# Patient Record
Sex: Female | Born: 1995
Health system: Southern US, Community
[De-identification: ages and names within clinical notes are randomized; demographics above are authoritative.]

## PROBLEM LIST (undated history)

## (undated) DIAGNOSIS — D649 Anemia, unspecified: Secondary | ICD-10-CM

## (undated) DIAGNOSIS — K589 Irritable bowel syndrome without diarrhea: Secondary | ICD-10-CM

## (undated) DIAGNOSIS — R000 Tachycardia, unspecified: Secondary | ICD-10-CM

## (undated) DIAGNOSIS — K2970 Gastritis, unspecified, without bleeding: Secondary | ICD-10-CM

## (undated) DIAGNOSIS — G43909 Migraine, unspecified, not intractable, without status migrainosus: Secondary | ICD-10-CM

## (undated) DIAGNOSIS — D249 Benign neoplasm of unspecified breast: Secondary | ICD-10-CM

## (undated) HISTORY — DX: Benign neoplasm of unspecified breast: D24.9

## (undated) HISTORY — DX: Migraine, unspecified, not intractable, without status migrainosus: G43.909

## (undated) HISTORY — PX: ANKLE SURGERY: SHX546

---
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
## 2015-02-19 NOTE — Consult Note (Signed)
PATIENT NAME:  Deborah Weber, Deborah Weber MR#:  [id number] DATE OF BIRTH:  [birth year]  DATE OF CONSULTATION:  07/19/2012  REFERRING PHYSICIAN:  [facility] CONSULTING PHYSICIAN:  Shadae Reino D. Taliana Mersereau, MD  INDICATION: Chest pain.  HISTORY OF PRESENT ILLNESS:  Ms. Beavers is a 19 year old African American female, active, recently postoperative from breast biopsy by Dr. Jamal Collin who complained of recurrent chest pain. This is a long-standing chronic problem, off and on. She has been evaluated when she was in the fourth grade, saw somebody in [place], is not clear on the name. Workup at that time was negative including a Holter. She has had some recurrent evaluations in the emergency room, most recently about two years ago. At that point, she had evaluation and limited work-up was also negative. She presented with left upper chest wall pain worse with deep inspiration and breathing and cough. The pain is usually at worse 6 out of 10. She does not recall any tachycardia, palpitations, or syncope. She is not really short of breath with this. She is not really taking any medications to help with it and the pain is intermittent and recurrent and self-limiting. She denies any trauma in the past.   REVIEW OF SYSTEMS: No blackout spells or syncope. No nausea or vomiting. No fever and no chills. No sweats, no weight loss, and no weight gain. No hemoptysis and no hematemesis. No bright red blood rectum.   PAST MEDICAL HISTORY: Essentially negative.   PAST SURGICAL HISTORY: Breast biopsy.  FAMILY HISTORY: Noncontributory.   SOCIAL HISTORY: She is a Ship broker. No smoking or alcohol consumption.   ALLERGIES: No known drug allergies.   PHYSICAL EXAMINATION:   VITAL SIGNS: Blood pressure 120/60, pulse 80, respiratory rate 18, and afebrile.   HEENT: Normocephalic, atraumatic. Pupils are reactive to light.   NECK: Supple. No jugular venous distention, bruits, or adenopathy.   LUNGS: Clear to auscultation  and percussion. No significant wheeze, rhonchi, or rales.   HEART: Regular rate and rhythm.   ABDOMEN: Positive bowel sounds. No rebound, guarding, or tenderness.   EXTREMITY: Examination is within normal limits.   NEUROLOGIC: Examination is intact.   SKIN: Normal.   HEART: She has a systolic ejection murmur in the left sternal border, 2/6. PMI is nondisplaced.   LABORATORY/DIAGNOSTIC DATA: EKG: Normal sinus rhythm and bradycardia with subtle ST elevation diffusely suggestive of J-point elevation.   Other preoperative laboratories are unremarkable.  ASSESSMENT:  1. Chest pain. 2. Murmur. 3. Status post breast biopsy.   PLAN: I would probably proceed with an echocardiogram to evaluate the patient's underlying cardiac structures to be sure they are normal and evaluation of murmur. We will probably hold off on a Holter monitor at this point. We will try to treat her conservatively with Motrin and Tylenol at this point. I suspect this is benign, nonspecific pain and aggressive further work-up I        think is not necessary. If her heart looks reasonably structurally normal and murmur shows to be benign, would probably defer further treatment and evaluation and recommend just conservative symptomatic therapy with Tylenol and Motrin. ____________________________ Loran Senters. Clayborn Bigness, MD ddc:slb D: 07/19/2012 15:16:27 ET T: 07/19/2012 16:23:53 ET JOB#: [id number]  cc: Sofija Antwi D. Clayborn Bigness, MD, <Dictator> Yolonda Kida MD ELECTRONICALLY SIGNED 08/20/2012 19:10

## 2015-02-22 NOTE — Op Note (Signed)
PATIENT NAME:  Deborah, Weber MR#:  [id number] DATE OF BIRTH:  06-Apr-1996  DATE OF PROCEDURE:  10/24/2013  PREOPERATIVE DIAGNOSIS:  Trimalleolar ankle fracture, left ankle.   POSTOPERATIVE DIAGNOSIS:  Trimalleolar ankle fracture, left ankle.  PROCEDURE: Open reduction and internal fixation medial malleolus and syndesmosis.   ANESTHESIA: General.   SURGEON: Hessie Knows, M.D.   DESCRIPTION OF PROCEDURE: The patient was brought to the operating room and after adequate anesthesia was obtained, the left leg was prepped and draped in the usual sterile fashion with a bump underneath the left buttock to internally rotate the leg. A tourniquet applied to the upper thigh. After prepping and draping in the usual sterile fashion and appropriate patient identification and timeout, procedure were carried out. The tourniquet was raised to 300 mmHg with inversion of the foot near anatomic alignment could be obtained of the syndesmosis and the fibular fracture. A small incision was made laterally and soft tissue spread. The 2.5 Synthes drill bit was used to drill across the fibula into the tibia, measuring and placing a 3.5 cortical screw. There is stability restored through the syndesmosis and on AP and lateral projections it appeared the small posterior malleolar fragment also was partially reduced.  Following this, reduction the medial malleolus was approached through an anterior medial incision, subcutaneous tissue spread and the fracture site opened and irrigated without any loose fragments from the joint. With the medial malleolar fragment held in place, a guidewire was inserted and measured, drilled, and a 32 mm, 4.5 partially-threaded cancellous screw was inserted. This gave good compression at the fracture site. At this point under stress views, there is no instability to the ankle. The wounds were irrigated and closed with 3-0 Vicryl subcutaneously, a 4-0 Monocryl subcuticular closure. And Dermabond,  Telfa, 4 x 4's, Webril ABD and stirrup splint were applied followed by Ace wrap. The patient was sent to recovery room in stable condition.  Tourniquet time: 32 minutes at 300 mmHg.  There were no complications.   SPECIMEN: None.    ____________________________ Laurene Footman, MD mjm:ce D: 10/24/2013 16:12:14 ET T: 10/24/2013 17:31:07 ET JOB#: [id number]  cc: Laurene Footman, MD, <Dictator> Laurene Footman MD ELECTRONICALLY SIGNED 10/25/2013 6:42

## 2016-04-11 ENCOUNTER — Emergency Department
Admission: EM | Admit: 2016-04-11 | Discharge: 2016-04-11 | Disposition: A | Discharge status: Home / Self Care | Payer: Worker's Compensation | Attending: Emergency Medicine | Admitting: Emergency Medicine

## 2016-04-11 ENCOUNTER — Encounter: Payer: Self-pay | Admitting: Emergency Medicine

## 2016-04-11 ENCOUNTER — Emergency Department: Payer: Worker's Compensation

## 2016-04-11 DIAGNOSIS — Y990 Civilian activity done for income or pay: Secondary | ICD-10-CM | POA: Insufficient documentation

## 2016-04-11 DIAGNOSIS — Y929 Unspecified place or not applicable: Secondary | ICD-10-CM | POA: Diagnosis not present

## 2016-04-11 DIAGNOSIS — S60111A Contusion of right thumb with damage to nail, initial encounter: Secondary | ICD-10-CM | POA: Insufficient documentation

## 2016-04-11 DIAGNOSIS — F17200 Nicotine dependence, unspecified, uncomplicated: Secondary | ICD-10-CM | POA: Insufficient documentation

## 2016-04-11 DIAGNOSIS — S6991XA Unspecified injury of right wrist, hand and finger(s), initial encounter: Secondary | ICD-10-CM | POA: Diagnosis present

## 2016-04-11 DIAGNOSIS — W208XXA Other cause of strike by thrown, projected or falling object, initial encounter: Secondary | ICD-10-CM | POA: Diagnosis not present

## 2016-04-11 DIAGNOSIS — Y9389 Activity, other specified: Secondary | ICD-10-CM | POA: Insufficient documentation

## 2016-04-11 MED ORDER — TRAMADOL HCL 50 MG PO TABS
50.0000 mg | ORAL_TABLET | Freq: Once | ORAL | Status: AC
Start: 2016-04-11 — End: 2016-04-11
  Administered 2016-04-11: 50 mg via ORAL
  Filled 2016-04-11: qty 1

## 2016-04-11 MED ORDER — TRAMADOL HCL 50 MG PO TABS: 50.0000 mg | ORAL_TABLET | Freq: Four times a day (QID) | ORAL | Status: DC | PRN

## 2016-04-11 MED ORDER — IBUPROFEN 600 MG PO TABS
600.0000 mg | ORAL_TABLET | Freq: Once | ORAL | Status: AC
  Administered 2016-04-11: 600 mg via ORAL
  Filled 2016-04-11: qty 1

## 2016-04-11 MED ORDER — IBUPROFEN 600 MG PO TABS: 600.0000 mg | ORAL_TABLET | Freq: Four times a day (QID) | ORAL | Status: DC | PRN

## 2016-04-11 NOTE — ED Notes (Signed)
Reports she was at work and Oncologist of plates were dropped on her right hand.  Patient complains of right thumb pain.

## 2016-04-11 NOTE — Discharge Instructions (Signed)
Wear splint for 2-3 days as needed. °

## 2016-04-11 NOTE — ED Notes (Signed)
Spoke with Freight forwarder, Erlene Quan at [facility] who says no urine drug screen required

## 2016-04-11 NOTE — ED Provider Notes (Signed)
[facility] Emergency Department Provider Note   ____________________________________________  Time seen: Approximately 8:52 PM  I have reviewed the triage vital signs and the nursing notes.   HISTORY  Chief Complaint Finger Injury    HPI Deborah Weber is a 20 y.o. female patient complaining of right thumb pain secondary to a stack loss place falling on her right hand. Patient's the incident occurred at work prior to arrival. Patient is right-hand dominant. Patient rates the pain as a 4/10. Patient states pain increase with flexion of the affected digit. No palliative measures applied until arrival at the ED she was given ice pack.   Past Medical History  Diagnosis Date  . Benign neoplasm of breast     fibroadenoma right breast    Patient Active Problem List   Diagnosis Date Noted  . History of breast lump/mass excision 07/25/2013  . Fibroadenoma of breast 07/25/2013    Past Surgical History  Procedure Laterality Date  . Breast biopsy Right 2013    excision fibroadenoma  . Ankle surgery Left     Current Outpatient Rx  Name  Route  Sig  Dispense  Refill  . ibuprofen (ADVIL,MOTRIN) 600 MG tablet   Oral   Take 1 tablet (600 mg total) by mouth every 6 (six) hours as needed.   30 tablet   0   . Norgestimate-Ethinyl Estradiol Triphasic (ORTHO TRI-CYCLEN, 28,) 0.18/0.215/0.25 MG-35 MCG tablet   Oral   Take 1 tablet by mouth daily.         . traMADol (ULTRAM) 50 MG tablet   Oral   Take 1 tablet (50 mg total) by mouth every 6 (six) hours as needed for moderate pain.   12 tablet   0     Allergies Review of patient's allergies indicates no known allergies.  No family history on file.  Social History Social History  Substance Use Topics  . Smoking status: Current Some Day Smoker  . Smokeless tobacco: Never Used  . Alcohol Use: Yes    Review of Systems Constitutional: No fever/chills Eyes: No visual changes. ENT: No  sore throat. Cardiovascular: Denies chest pain. Respiratory: Denies shortness of breath. Gastrointestinal: No abdominal pain.  No nausea, no vomiting.  No diarrhea.  No constipation. Genitourinary: Negative for dysuria. Musculoskeletal: Right thumb pain  Skin: Negative for rash. Neurological: Negative for headaches, focal weakness or numbness.     ____________________________________________   PHYSICAL EXAM:  VITAL SIGNS: ED Triage Vitals  Enc Vitals Group     BP 04/11/16 2023 118/75 mmHg     Pulse Rate 04/11/16 2023 85     Resp 04/11/16 2023 18     Temp 04/11/16 2023 98.2 F (36.8 C)     Temp Source 04/11/16 2023 Oral     SpO2 04/11/16 2023 99 %     Weight 04/11/16 2023 158 lb (71.668 kg)     Height 04/11/16 2023 5\' 7"  (1.702 m)     Head Cir --      Peak Flow --      Pain Score 04/11/16 2024 4     Pain Loc --      Pain Edu? --      Excl. in [place]? --     Constitutional: Alert and oriented. Well appearing and in no acute distress. Eyes: Conjunctivae are normal. PERRL. EOMI. Head: Atraumatic. Nose: No congestion/rhinnorhea. Mouth/Throat: Mucous membranes are moist.  Oropharynx non-erythematous. Neck: No stridor.  No cervical spine tenderness to palpation.  Hematological/Lymphatic/Immunilogical: No cervical lymphadenopathy. Cardiovascular: Normal rate, regular rhythm. Grossly normal heart sounds.  Good peripheral circulation. Respiratory: Normal respiratory effort.  No retractions. Lungs CTAB. Gastrointestinal: Soft and nontender. No distention. No abdominal bruits. No CVA tenderness. Musculoskeletal: No obvious deformity of the right thumb. Neurovascular intact. Patient is moderate guarding with palpation of the mid phalange. Decreased range of motion limited by complaining of pain with flexion.  Neurologic:  Normal speech and language. No gross focal neurologic deficits are appreciated. No gait instability. Skin:  Skin is warm, dry and intact. No rash noted. Psychiatric:  Mood and affect are normal. Speech and behavior are normal.  ____________________________________________   LABS (all labs ordered are listed, but only abnormal results are displayed)  Labs Reviewed - No data to display ____________________________________________  EKG   ____________________________________________  RADIOLOGY  No acute final chest x-ray of the right thumb. ____________________________________________   PROCEDURES  Procedure(s) performed: None  Critical Care performed: No  ____________________________________________   INITIAL IMPRESSION / ASSESSMENT AND PLAN / ED COURSE  Pertinent labs & imaging results that were available during my care of the patient were reviewed by me and considered in my medical decision making (see chart for details).  Thumb contusion. Discussed x-ray results with patient. Patient placed in a finger splint and given prescription for tramadol and ibuprofen. Patient advised to follow-up with family doctor condition does not improve in 2-3 days. ____________________________________________   FINAL CLINICAL IMPRESSION(S) / ED DIAGNOSES  Final diagnoses:  Contusion of right thumb nail, initial encounter      NEW MEDICATIONS STARTED DURING THIS VISIT:  New Prescriptions   IBUPROFEN (ADVIL,MOTRIN) 600 MG TABLET    Take 1 tablet (600 mg total) by mouth every 6 (six) hours as needed.   TRAMADOL (ULTRAM) 50 MG TABLET    Take 1 tablet (50 mg total) by mouth every 6 (six) hours as needed for moderate pain.     Note:  This document was prepared using Dragon voice recognition software and may include unintentional dictation errors.    Sable Feil, PA-C 04/11/16 2126  Delman Kitten, MD 04/12/16 [phone]

## 2016-09-12 ENCOUNTER — Encounter: Payer: Self-pay | Admitting: Emergency Medicine

## 2016-09-12 ENCOUNTER — Emergency Department
Admission: EM | Admit: 2016-09-12 | Discharge: 2016-09-13 | Disposition: A | Discharge status: Home / Self Care | Payer: BC Managed Care – PPO | Attending: Emergency Medicine | Admitting: Emergency Medicine

## 2016-09-12 ENCOUNTER — Emergency Department: Payer: BC Managed Care – PPO

## 2016-09-12 DIAGNOSIS — R69 Illness, unspecified: Secondary | ICD-10-CM

## 2016-09-12 DIAGNOSIS — R8299 Other abnormal findings in urine: Secondary | ICD-10-CM | POA: Insufficient documentation

## 2016-09-12 DIAGNOSIS — Z79899 Other long term (current) drug therapy: Secondary | ICD-10-CM | POA: Insufficient documentation

## 2016-09-12 DIAGNOSIS — F17200 Nicotine dependence, unspecified, uncomplicated: Secondary | ICD-10-CM | POA: Diagnosis not present

## 2016-09-12 DIAGNOSIS — R42 Dizziness and giddiness: Secondary | ICD-10-CM | POA: Diagnosis present

## 2016-09-12 DIAGNOSIS — J111 Influenza due to unidentified influenza virus with other respiratory manifestations: Secondary | ICD-10-CM

## 2016-09-12 DIAGNOSIS — B349 Viral infection, unspecified: Secondary | ICD-10-CM | POA: Insufficient documentation

## 2016-09-12 LAB — CBC WITH DIFFERENTIAL/PLATELET
BASOS PCT: 1 %
Basophils Absolute: 0 10*3/uL (ref 0–0.1)
Eosinophils Absolute: 0 10*3/uL (ref 0–0.7)
Eosinophils Relative: 0 %
HEMATOCRIT: 38.3 % (ref 35.0–47.0)
Hemoglobin: 12.8 g/dL (ref 12.0–16.0)
LYMPHS ABS: 0.7 10*3/uL — AB (ref 1.0–3.6)
Lymphocytes Relative: 11 %
MCH: 23.9 pg — AB (ref 26.0–34.0)
MCHC: 33.5 g/dL (ref 32.0–36.0)
MCV: 71.3 fL — AB (ref 80.0–100.0)
MONO ABS: 0.9 10*3/uL (ref 0.2–0.9)
MONOS PCT: 14 %
NEUTROS ABS: 4.7 10*3/uL (ref 1.4–6.5)
Neutrophils Relative %: 74 %
Platelets: 205 10*3/uL (ref 150–440)
RBC: 5.37 MIL/uL — ABNORMAL HIGH (ref 3.80–5.20)
RDW: 15.1 % — AB (ref 11.5–14.5)
WBC: 6.3 10*3/uL (ref 3.6–11.0)

## 2016-09-12 LAB — COMPREHENSIVE METABOLIC PANEL
ALT: 22 U/L (ref 14–54)
AST: 21 U/L (ref 15–41)
Albumin: 4.4 g/dL (ref 3.5–5.0)
Alkaline Phosphatase: 59 U/L (ref 38–126)
Anion gap: 7 (ref 5–15)
BILIRUBIN TOTAL: 0.5 mg/dL (ref 0.3–1.2)
BUN: 8 mg/dL (ref 6–20)
CHLORIDE: 102 mmol/L (ref 101–111)
CO2: 26 mmol/L (ref 22–32)
CREATININE: 0.74 mg/dL (ref 0.44–1.00)
Calcium: 9.2 mg/dL (ref 8.9–10.3)
GFR calc Af Amer: >60 mL/min (ref >60)
GLUCOSE: 82 mg/dL (ref 65–99)
Potassium: 3.6 mmol/L (ref 3.5–5.1)
Sodium: 135 mmol/L (ref 135–145)
Total Protein: 8.8 g/dL — ABNORMAL HIGH (ref 6.5–8.1)

## 2016-09-12 LAB — URINALYSIS COMPLETE WITH MICROSCOPIC (ARMC ONLY)
BILIRUBIN URINE: NEGATIVE
GLUCOSE, UA: NEGATIVE mg/dL
Ketones, ur: NEGATIVE mg/dL
Leukocytes, UA: NEGATIVE
NITRITE: NEGATIVE
Protein, ur: NEGATIVE mg/dL
Specific Gravity, Urine: 1.006 (ref 1.005–1.030)
pH: 7 (ref 5.0–8.0)

## 2016-09-12 LAB — INFLUENZA PANEL BY PCR (TYPE A & B)
INFLAPCR: NEGATIVE
INFLBPCR: NEGATIVE

## 2016-09-12 LAB — HCG, QUANTITATIVE, PREGNANCY

## 2016-09-12 LAB — LACTIC ACID, PLASMA: Lactic Acid, Venous: 1.2 mmol/L (ref 0.5–1.9)

## 2016-09-12 MED ORDER — SODIUM CHLORIDE 0.9 % IV SOLN
1000.0000 mL | Freq: Once | INTRAVENOUS | Status: AC
  Administered 2016-09-12: 1000 mL via INTRAVENOUS

## 2016-09-12 MED ORDER — ACETAMINOPHEN 325 MG PO TABS
650.0000 mg | ORAL_TABLET | Freq: Once | ORAL | Status: AC
  Administered 2016-09-12: 650 mg via ORAL
  Filled 2016-09-12: qty 2

## 2016-09-12 NOTE — ED Provider Notes (Signed)
-----------------------------------------   11:21 PM on 09/12/2016 -----------------------------------------   Blood pressure 122/82, pulse (!) 123, temperature (!) 102.4 F (39.1 C), temperature source Oral, resp. rate 18, height 5\' 8"  (1.727 m), weight 75.9 kg, last menstrual period 08/26/2016, SpO2 100 %.  Assuming care from Dr. Corky Downs.  In short, Deborah Weber is a 20 y.o. female with a chief complaint of Chills; Near Syncope; Fatigue; Sore Throat; Fever; and Generalized Body Aches .  Refer to the original H&P for additional details.  The current plan of care is to provide a second liter of IV fluids and check the remainder of her labs including influenza panel.  Dr. Corky Downs doubts need for admission if we can improve her vital signs and rule out acute bacterial infection.Marland Kitchen   ----------------------------------------- 1:01 AM on 09/13/2016 -----------------------------------------  The patient's heart rate is down to the low 110s.  She still has a bit of a fever for which I gave her a dose of ibuprofen.  Her workup was reassuring with a normal lactate, no leukocytosis, normal metabolic panel, negative influenza panel, normal chest x-ray, normal urinalysis.  Blood cultures are pending.  I gave her the reassuring results and my usual and customary management recommendations and return precautions.  She and her family member understand and agree with the plan.   Hinda Kehr, MD 09/13/16 [phone]

## 2016-09-12 NOTE — ED Notes (Signed)
Patient transported to X-ray 

## 2016-09-12 NOTE — ED Provider Notes (Signed)
[facility] Emergency Department Provider Note   ____________________________________________    I have reviewed the triage vital signs and the nursing notes.   HISTORY  Chief Complaint Chills; Near Syncope; Fatigue; Sore Throat; Fever; and Generalized Body Aches     HPI Deborah Weber is a 20 y.o. female who presents with complaints of lightheadedness. Patient reports she felt like she was going to faint approximately 1 hour prior to arrival. She also reports she has had chills and body aches. She denies nausea or vomiting. No recent travel. No sick contacts reported. No chest pain or palpitations. She reports she was seen in the [place] ED for a near syncopal episode 2 weeks ago but they were unable to determine a cause. She denies cough or shortness of breath. No dysuria or vaginal discharge. no neck pain or stiffness   Past Medical History:  Diagnosis Date  . Benign neoplasm of breast    fibroadenoma right breast    Patient Active Problem List   Diagnosis Date Noted  . History of breast lump/mass excision 07/25/2013  . Fibroadenoma of breast 07/25/2013    Past Surgical History:  Procedure Laterality Date  . ANKLE SURGERY Left   . BREAST BIOPSY Right 2013   excision fibroadenoma    Prior to Admission medications   Not on File     Allergies Patient has no known allergies.  History reviewed. No pertinent family history.  Social History Social History  Substance Use Topics  . Smoking status: Current Some Day Smoker  . Smokeless tobacco: Never Used  . Alcohol use Yes    Review of Systems  Constitutional: Positive chills Eyes: No discharge ENT: No sore throat. Cardiovascular: Denies chest pain. Respiratory: Denies shortness of breath. Gastrointestinal: No abdominal pain.  No nausea, no vomiting.   Genitourinary: Negative for dysuria. Musculoskeletal: Positive myalgias Skin: Negative for rash. Neurological: Negative for  headaches or weakness  10-point ROS otherwise negative.  ____________________________________________   PHYSICAL EXAM:  VITAL SIGNS: ED Triage Vitals  Enc Vitals Group     BP 09/12/16 2109 118/72     Pulse Rate 09/12/16 2109 (!) 138     Resp 09/12/16 2109 18     Temp 09/12/16 2109 (!) 102.4 F (39.1 C)     Temp Source 09/12/16 2109 Oral     SpO2 09/12/16 2109 100 %     Weight 09/12/16 2109 165 lb (74.8 kg)     Height 09/12/16 2109 5\' 8"  (1.727 m)     Head Circumference --      Peak Flow --      Pain Score 09/12/16 2120 6     Pain Loc --      Pain Edu? --      Excl. in [place]? --     Constitutional: Alert and oriented. No acute distress. Pleasant and interactive Eyes: Conjunctivae are normal.  Head: Atraumatic. Nose: No congestion/rhinnorhea. Mouth/Throat: Mucous membranes are moist.   Neck:  Painless ROM, No meningismus Cardiovascular: Tachycardia, regular rhythm. Grossly normal heart sounds.  Good peripheral circulation. Respiratory: Normal respiratory effort.  No retractions. Lungs CTAB. Gastrointestinal: Soft and nontender. No distention.  No CVA tenderness. Genitourinary: deferred Musculoskeletal: No lower extremity tenderness nor edema.  Warm and well perfused Neurologic:  Normal speech and language. No gross focal neurologic deficits are appreciated.  Skin:  Skin is warm, dry and intact. No rash noted. Psychiatric: Mood and affect are normal. Speech and behavior are normal.  ____________________________________________  LABS (all labs ordered are listed, but only abnormal results are displayed)  Labs Reviewed  COMPREHENSIVE METABOLIC PANEL - Abnormal; Notable for the following:       Result Value   Total Protein 8.8 (*)    All other components within normal limits  CBC WITH DIFFERENTIAL/PLATELET - Abnormal; Notable for the following:    RBC 5.37 (*)    MCV 71.3 (*)    MCH 23.9 (*)    RDW 15.1 (*)    Lymphs Abs 0.7 (*)    All other components within  normal limits  CULTURE, BLOOD (ROUTINE X 2)  CULTURE, BLOOD (ROUTINE X 2)  URINE CULTURE  LACTIC ACID, PLASMA  HCG, QUANTITATIVE, PREGNANCY  LACTIC ACID, PLASMA  URINALYSIS COMPLETEWITH MICROSCOPIC ([facility] ONLY)  INFLUENZA PANEL BY PCR (TYPE A & B, H1N1)   ____________________________________________  EKG  None ____________________________________________  RADIOLOGY  Chest x-ray unremarkable ____________________________________________   PROCEDURES  Procedure(s) performed: No    Critical Care performed: No ____________________________________________   INITIAL IMPRESSION / ASSESSMENT AND PLAN / ED COURSE  Pertinent labs & imaging results that were available during my care of the patient were reviewed by me and considered in my medical decision making (see chart for details).  Patient well-appearing and in no distress. Exam is notable only for tachycardia and fever. Patient had extensive lab work sent in triage of which is reassuring. Chest x-ray is benign. Pending urinalysis and influenza PCR. Strongly suspect viral illness given myalgias and fever, we will give IV fluids and provide supportive care and reevaluate.  I will ask my colleague Dr. Karma Greaser to follow up on influenza pcr and to reevaluate. Anticipate discharge  Clinical Course    ____________________________________________   FINAL CLINICAL IMPRESSION(S) / ED DIAGNOSES  Viral illness  NEW MEDICATIONS STARTED DURING THIS VISIT:  New Prescriptions   No medications on file     Note:  This document was prepared using Dragon voice recognition software and may include unintentional dictation errors.    Lavonia Drafts, MD 09/12/16 2250

## 2016-09-12 NOTE — ED Triage Notes (Addendum)
Pt presents with c/o chills and feeling like she's going to pass out; pt says she was admitted at [facility] 2 weeks ago for syncopal episodes and tachycardia; pt says she was discharged home with a heart monitor, wore that for 2 days,  but she has not heard any results; pt unsure what her discharge diagnosis was from [facility]; pt says she is feeling similar; adds 2 days ago she was seen at urgent care for sudden onset of facial/tongue swelling; pt presents with fever, which she did not know she had, itching throat and body aches;

## 2016-09-12 NOTE — ED Notes (Signed)
ED Provider at bedside. 

## 2016-09-13 MED ORDER — IBUPROFEN 600 MG PO TABS
600.0000 mg | ORAL_TABLET | Freq: Once | ORAL | Status: AC
  Administered 2016-09-13: 600 mg via ORAL
  Filled 2016-09-13: qty 1

## 2016-09-13 NOTE — Discharge Instructions (Signed)
You have been seen in the Emergency Department (ED) today for a likely viral illness.  Please drink plenty of clear fluids (water, Gatorade, chicken broth, etc).  You may use Tylenol and/or Motrin according to label instructions.  You can alternate between the two without any side effects. Drink plenty of clear fluids (water, Gatorade, etc), even if you do not feel like eating much food, though we encourage you to eat in order to keep up your strength.  Please follow up with your doctor as listed above.  Call your doctor or return to the Emergency Department (ED) if you are unable to tolerate fluids due to vomiting, have worsening trouble breathing, become extremely tired or difficult to awaken, or if you develop any other symptoms that concern you.

## 2016-09-14 LAB — URINE CULTURE

## 2016-09-17 LAB — CULTURE, BLOOD (ROUTINE X 2)
Culture: NO GROWTH
Culture: NO GROWTH

## 2017-03-19 ENCOUNTER — Encounter: Payer: Self-pay | Admitting: Advanced Practice Midwife

## 2017-03-19 ENCOUNTER — Ambulatory Visit (INDEPENDENT_AMBULATORY_CARE_PROVIDER_SITE_OTHER): Payer: BC Managed Care – PPO | Admitting: Advanced Practice Midwife

## 2017-03-19 VITALS — BP 100/62 | HR 86 | Ht 68.0 in | Wt 171.0 lb

## 2017-03-19 DIAGNOSIS — Z01419 Encounter for gynecological examination (general) (routine) without abnormal findings: Secondary | ICD-10-CM | POA: Diagnosis not present

## 2017-03-19 DIAGNOSIS — Z113 Encounter for screening for infections with a predominantly sexual mode of transmission: Secondary | ICD-10-CM | POA: Diagnosis not present

## 2017-03-19 DIAGNOSIS — A749 Chlamydial infection, unspecified: Secondary | ICD-10-CM

## 2017-03-19 DIAGNOSIS — N912 Amenorrhea, unspecified: Secondary | ICD-10-CM | POA: Diagnosis not present

## 2017-03-19 LAB — POCT URINE PREGNANCY: PREG TEST UR: NEGATIVE

## 2017-03-19 NOTE — Progress Notes (Signed)
Patient ID: Deborah Weber, female   DOB: [birth year], 21 y.o.   MRN: [id number]      Gynecology Annual Exam  PCP: Gayland Curry, MD  Chief Complaint:  Chief Complaint  Patient presents with  . Gynecologic Exam    Possible SAB 3 pos. test in Feb    History of Present Illness: Patient is a 21 y.o. G0P0 presents for annual exam. The patient has no complaints today. She had a possible pregnancy/miscarriage in February. She had positive home test and negative clinic test. Then she had bleeding a few days later at the end of February. She has Nexplanon which is now 21 years old. She has a negative pregnancy test today. She is considering options for birth control and will probably use Nexplanon again. She is an occasional smoker and is advised against using combined birth control products. She has had irregular bleeding ever since she has had the Nexplanon.  LMP: No LMP recorded. Patient has had an implant. Average Interval: irregular, no pattern days Duration of flow: several days Heavy Menses: no Clots: no Intermenstrual Bleeding: no Postcoital Bleeding: no Dysmenorrhea: no  The patient is sexually active. She currently uses Nexplanon for contraception. She denies dyspareunia.  The patient does perform self breast exams.  There is notable family history of breast or ovarian cancer in her family.  The patient wears seatbelts: yes.  The patient has regular exercise: yes.    The patient denies current symptoms of depression.    Review of Systems: Review of Systems  Constitutional: Negative.   HENT: Negative.   Eyes: Negative.   Respiratory: Negative.   Cardiovascular: Negative.   Gastrointestinal: Negative.   Genitourinary: Negative.   Musculoskeletal: Negative.   Skin: Negative.   Neurological: Negative.   Endo/Heme/Allergies: Negative.   Psychiatric/Behavioral: Negative.     Past Medical History:  Past Medical History:  Diagnosis Date  . Benign neoplasm of breast     fibroadenoma right breast    Past Surgical History:  Past Surgical History:  Procedure Laterality Date  . ANKLE SURGERY Left   . BREAST BIOPSY Right 2013   excision fibroadenoma    Gynecologic History:  No LMP recorded. Patient has had an implant. Contraception: Nexplanon Last Pap: Results were: has never had a PAP   Obstetric History: G0P0  Family History:  Family History  Problem Relation Age of Onset  . Colon cancer Paternal Aunt 60  . Breast cancer Paternal Grandmother 84    Social History:  Social History   Social History  . Marital status: Single    Spouse name: N/A  . Number of children: N/A  . Years of education: N/A   Occupational History  . Not on file.   Social History Main Topics  . Smoking status: Current Some Day Smoker  . Smokeless tobacco: Never Used  . Alcohol use Yes  . Drug use: No  . Sexual activity: Yes    Birth control/ protection: Implant   Other Topics Concern  . Not on file   Social History Narrative  . No narrative on file    Allergies:  No Known Allergies  Medications: Prior to Admission medications   Medication Sig Start Date End Date Taking? Authorizing Provider  EPINEPHrine 0.3 mg/0.3 mL IJ SOAJ injection Inject 0.3 mg into the muscle as needed. 09/07/16  Yes [provider]  etonogestrel (NEXPLANON) 68 MG IMPL implant Inject into the skin.   Yes [provider]    Physical Exam Vitals:  Blood pressure 100/62, pulse 86, height 5\' 8"  (1.727 m), weight 171 lb (77.6 kg).  General: NAD HEENT: normocephalic, anicteric Thyroid: no enlargement, no palpable nodules Pulmonary: No increased work of breathing, CTAB Cardiovascular: RRR, distal pulses 2+ Breast: Breast symmetrical, no tenderness, no palpable nodules or masses, no skin or nipple retraction present, no nipple discharge.  No axillary or supraclavicular lymphadenopathy. Abdomen: NABS, soft, non-tender, non-distended.  Umbilicus without lesions.   No hepatomegaly, splenomegaly or masses palpable. No evidence of hernia  Genitourinary:  External: Normal external female genitalia.  Normal urethral meatus, normal  Bartholin's and Skene's glands.    Vagina: Normal vaginal mucosa, no evidence of prolapse.    Cervix: Grossly normal in appearance, no bleeding, no CMT  Uterus: Non-enlarged, mobile, normal contour.    Adnexa: ovaries non-enlarged, no adnexal masses  Rectal: deferred  Lymphatic: no evidence of inguinal lymphadenopathy Extremities: no edema, erythema, or tenderness Neurologic: Grossly intact Psychiatric: mood appropriate, affect full    Assessment: 21 y.o. G0P0 Well woman exam without PAP  Plan: Problem List Items Addressed This Visit    None    Visit Diagnoses    Amenorrhea    -  Primary   Relevant Orders   POCT urine pregnancy (Completed)   Well woman exam with routine gynecological exam       Relevant Orders   Chlamydia/Gonococcus/Trichomonas, NAA   Screen for sexually transmitted diseases       Relevant Orders   Chlamydia/Gonococcus/Trichomonas, NAA      1) 4) Gardasil Series discussed and if applicable offered to patient - Patient is unsure if she has previously completed 3 shot series. She is advised to let us know if she wants the series if she has not already had it.  2) STI screening was offered and accepted   3) ASCCP guidelines and rational discussed.  Patient opts for begin at age 23 screening interval  4) Contraception - Education given regarding options for contraception, including IUD, Nexplanon, Depo and minipill. She is advised against combined pills/products due to her use of tobacco   5) Follow up soon for removal and reinsertion of Nexplanon and 1 year for routine annual exam   Rod Can, CNM

## 2017-03-22 LAB — CHLAMYDIA/GONOCOCCUS/TRICHOMONAS, NAA
CHLAMYDIA BY NAA: POSITIVE — AB
GONOCOCCUS BY NAA: NEGATIVE
TRICH VAG BY NAA: NEGATIVE

## 2017-03-23 MED ORDER — AZITHROMYCIN 500 MG PO TABS: 1000.0000 mg | ORAL_TABLET | Freq: Once | ORAL | 0 refills | Status: AC

## 2017-03-23 NOTE — Addendum Note (Signed)
Addended by: Rod Can on: 03/23/2017 04:40 PM   Modules accepted: Orders

## 2017-04-02 ENCOUNTER — Ambulatory Visit (INDEPENDENT_AMBULATORY_CARE_PROVIDER_SITE_OTHER): Payer: BC Managed Care – PPO | Admitting: Advanced Practice Midwife

## 2017-04-02 ENCOUNTER — Encounter: Payer: Self-pay | Admitting: Advanced Practice Midwife

## 2017-04-02 VITALS — BP 118/74 | Ht 68.0 in | Wt 175.0 lb

## 2017-04-02 DIAGNOSIS — Z308 Encounter for other contraceptive management: Secondary | ICD-10-CM

## 2017-04-02 DIAGNOSIS — Z113 Encounter for screening for infections with a predominantly sexual mode of transmission: Secondary | ICD-10-CM

## 2017-04-02 NOTE — Progress Notes (Signed)
GYNECOLOGY PROCEDURE NOTE  Nexplanon removal discussed in detail.  Risks of infection, bleeding, nerve injury all reviewed.  Patient understands risks and desires to proceed.  Verbal consent obtained.  Patient is certain she wants the Nexplanon removed.  All questions answered.  Procedure: Patient placed in dorsal supine with left arm above head, elbow flexed at 90 degrees, arm resting on examination table.  Nexplanon identified without problems.  Betadine scrub x3.  2 ml of 1% lidocaine injected under Nexplanon device without problems.  Sterile gloves applied.  Small 0.5cm incision made at distal tip of implanon device with 11 blade scalpel.  Nexplanon brought to incision and grasped with a small kelly clamp.  Nexplanon removed intact without problems.  Pressure applied to incision.  Patient tolerated procedure well.  No complications.   Assessment: 21 y.o. year old female now s/p uncomplicated Nexplanon removal.  Plan: 1.  Patient given post procedure precautions and asked to call for fever, chills, redness or drainage from her incision, bleeding from incision.  She understands she will likely have a small bruise near site of removal and can remove bandage tomorrow and steri-strips in approximately 1 week.  2) Contraception: reinsertion of new Nexplanon. See note below         GYNECOLOGY PROCEDURE NOTE  Patient is a 21 y.o. G0P0 presenting for Nexplanon insertion as her desires means of contraception.  She provided informed consent, signed copy in the chart, time out was performed. Pregnancy test was negative on 5/18. No LMP recorded. Patient has had an implant.  She understands that Nexplanon is a progesterone only therapy, and that patients often patients have irregular and unpredictable vaginal bleeding or amenorrhea. She understands that other side effects are possible related to systemic progesterone, including but not limited to, headaches, breast tenderness, nausea, and  irritability. While effective at preventing pregnancy long acting reversible contraceptives do not prevent transmission of sexually transmitted diseases and use of barrier methods for this purpose was discussed. The placement procedure for Nexplanon was reviewed with the patient in detail including risks of nerve injury, infection, bleeding and injury to other muscles or tendons. She understands that the Nexplanon implant is good for 3 years and needs to be removed at the end of that time.  She understands that Nexplanon is an extremely effective option for contraception, with failure rate of <1%. This information is reviewed today and all questions were answered. Informed consent was obtained, both verbally and written.   The patient is healthy and has no contraindications to Nexplanon use.   Procedure Appropriate time out taken.  Patient placed in dorsal supine with left arm above head, elbow flexed at 90 degrees, arm resting on examination table.  Nexplanon removed form sterile blister packaging,  Device confirmed in needle, before inserting full length of needle, tenting up the skin as the needle was advanced along the track of the device that was previously removed.  The drug eluting rod was then deployed by pulling back the slider per the manufactures recommendation.  The implant was palpable by the clinician as well as the patient.  The insertion site covered dressed with a band aid before applying  a kerlex bandage pressure dressing..Minimal blood loss was noted during the procedure.  The patient tolerated the procedure well.   She was instructed to wear the bandage for 24 hours, call with any signs of infection.  She was given the Nexplanon card and instructed to have the rod removed in 3 years.  Charge [phone] for nexplanon device, CPT [id number] for procedure

## 2017-04-02 NOTE — Addendum Note (Signed)
Addended by: Rod Can on: 04/02/2017 01:35 PM   Modules accepted: Orders

## 2017-07-09 ENCOUNTER — Emergency Department: Payer: BC Managed Care – PPO

## 2017-07-09 ENCOUNTER — Emergency Department
Admission: EM | Admit: 2017-07-09 | Discharge: 2017-07-09 | Discharge status: Against Medical Advice | Payer: BC Managed Care – PPO | Attending: Emergency Medicine | Admitting: Emergency Medicine

## 2017-07-09 ENCOUNTER — Encounter: Payer: Self-pay | Admitting: Emergency Medicine

## 2017-07-09 DIAGNOSIS — F17200 Nicotine dependence, unspecified, uncomplicated: Secondary | ICD-10-CM | POA: Diagnosis not present

## 2017-07-09 DIAGNOSIS — Z79899 Other long term (current) drug therapy: Secondary | ICD-10-CM | POA: Diagnosis not present

## 2017-07-09 DIAGNOSIS — R042 Hemoptysis: Secondary | ICD-10-CM | POA: Diagnosis present

## 2017-07-09 HISTORY — DX: Gastritis, unspecified, without bleeding: K29.70

## 2017-07-09 LAB — BASIC METABOLIC PANEL
Anion gap: 6 (ref 5–15)
BUN: 8 mg/dL (ref 6–20)
CO2: 24 mmol/L (ref 22–32)
CREATININE: 0.66 mg/dL (ref 0.44–1.00)
Calcium: 9.1 mg/dL (ref 8.9–10.3)
Chloride: 107 mmol/L (ref 101–111)
Glucose, Bld: 92 mg/dL (ref 65–99)
Potassium: 3.6 mmol/L (ref 3.5–5.1)
SODIUM: 137 mmol/L (ref 135–145)

## 2017-07-09 LAB — CBC
HCT: 34.9 % — ABNORMAL LOW (ref 35.0–47.0)
Hemoglobin: 11.9 g/dL — ABNORMAL LOW (ref 12.0–16.0)
MCH: 24.2 pg — AB (ref 26.0–34.0)
MCHC: 34 g/dL (ref 32.0–36.0)
MCV: 71.2 fL — ABNORMAL LOW (ref 80.0–100.0)
PLATELETS: 217 10*3/uL (ref 150–440)
RBC: 4.9 MIL/uL (ref 3.80–5.20)
RDW: 14.4 % (ref 11.5–14.5)
WBC: 4.9 10*3/uL (ref 3.6–11.0)

## 2017-07-09 NOTE — ED Triage Notes (Signed)
Pt reports one episode of coughing up blood today at 3:15. Pt reports coughed up approximately one tablespoon of bright red blood. Pt reports recently diagnosed with gastritis and has been told she had some bleeding in small intestine. Pt denies any pain. Denies NVD. Denies any recent cold or flu symptoms. Pt ambulatory to triage. No apparent distress noted.

## 2017-07-09 NOTE — ED Notes (Signed)
This nurse walked into room; Pt was not in room waited 15 minutes and checked with triage, pt not here at present time/without return. MD notified

## 2017-07-09 NOTE — ED Notes (Signed)
Pt not here to sign AMA form

## 2017-07-09 NOTE — ED Provider Notes (Addendum)
[facility] Emergency Department Provider Note  Time seen: 6:53 PM  I have reviewed the triage vital signs and the nursing notes.   HISTORY  Chief Complaint Hemoptysis    HPI Deborah Weber is a 21 y.o. female With a past medical history of gastritis on Protonix, presents to the emergency department with hemoptysis. According to the patient she was coughing tonight which she states was a prolonged coughing spell approximately 1 minute and then she got her sputum up which contained a large amount of blood. Patient states she has not had any blood since. Has not had any further cough since. Denies any fever. Denies any shortness of breath. Denies any chest pain. Patient denies any history of bleeding.  denies any complaints at this time.  Past Medical History:  Diagnosis Date  . 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 Gastritis     Patient Active Problem List   Diagnosis Date Noted  . History of breast lump/mass excision 07/25/2013  . Fibroadenoma of breast 07/25/2013    Past Surgical History:  Procedure Laterality Date  . ANKLE SURGERY Left   . BREAST BIOPSY Right 2013   excision fibroadenoma    Prior to Admission medications   Medication Sig Start Date End Date Taking? Authorizing Provider  pantoprazole (PROTONIX) 40 MG tablet Take 40 mg by mouth daily.   Yes [provider]  EPINEPHrine 0.3 mg/0.3 mL IJ SOAJ injection Inject 0.3 mg into the muscle as needed. 09/07/16   [provider]  etonogestrel (NEXPLANON) 68 MG IMPL implant Inject into the skin.    [provider]    No Known Allergies  Family History  Problem Relation Age of Onset  . Colon cancer Paternal Aunt 2  . Breast cancer Paternal Grandmother 20    Social History Social History  Substance Use Topics  . Smoking status: Current Some Day Smoker  . Smokeless tobacco: Never Used  . Alcohol use Yes    Review of  Systems Constitutional: Negative for fever. Cardiovascular: Negative for chest pain. Respiratory: Negative for shortness of breath. Gastrointestinal: Negative for abdominal pain. Negative nausea or vomiting. Neurological: Negative for headache All other ROS negative  ____________________________________________   PHYSICAL EXAM:  VITAL SIGNS: ED Triage Vitals [07/09/17 1601]  Enc Vitals Group     BP 128/73     Pulse Rate 72     Resp 18     Temp 98.7 F (37.1 C)     Temp Source Oral     SpO2 99 %     Weight 165 lb (74.8 kg)     Height 5\' 8"  (1.727 m)     Head Circumference      Peak Flow      Pain Score      Pain Loc      Pain Edu?      Excl. in [place]?     Constitutional: Alert and oriented. Well appearing and in no distress. Eyes: Normal exam ENT   Head: Normocephalic and atraumatic   Mouth/Throat: Mucous membranes are moist. Cardiovascular: Normal rate, regular rhythm. No murmur Respiratory: Normal respiratory effort without tachypnea nor retractions. Breath sounds are clear  Gastrointestinal: Soft and nontender. No distention.   Musculoskeletal: Nontender with normal range of motion in all extremities.  Neurologic:  Normal speech and language. No gross focal neurologic deficits  Skin:  Skin is warm, dry and intact.  Psychiatric: Mood and affect are normal.  ____________________________________________   INITIAL IMPRESSION / ASSESSMENT AND PLAN / ED COURSE  Pertinent labs & imaging results that were available during my care of the patient were reviewed by me and considered in my medical decision making (see chart for details).  the patient presents to the emergency department with hemoptysis 1 tonight. Patient denies any recent coughing, states she just had one spell of coughing and then was able to get her sputum up which contained blood. Denies any further coughing. Denies any chest pain or trouble breathing.  Patient's labs are normal. Patient's exam is  normal. Overall the patient is very well-appearing and states she has no complaints at this time. I ordered a chest x-ray to further evaluate. The patient is refusing x-ray at this time. States she just had one to 3 weeks ago and does not want a repeat x-ray, per x-ray tech.   I went to discuss this with the patientshortly after this and the patient had eloped from the emergency department.  ____________________________________________   FINAL CLINICAL IMPRESSION(S) / ED DIAGNOSES  hemoptysis    Harvest Dark, MD 07/09/17 [id number]    Harvest Dark, MD 07/09/17 [phone]

## 2017-07-09 NOTE — ED Notes (Signed)
Pt refused chest x-ray

## 2017-09-24 ENCOUNTER — Inpatient Hospital Stay (HOSPITAL_COMMUNITY)
Admission: AD | Admit: 2017-09-24 | Discharge: 2017-09-24 | Disposition: A | Discharge status: Home / Self Care | Payer: BC Managed Care – PPO | Source: Ambulatory Visit | Attending: Obstetrics and Gynecology | Admitting: Obstetrics and Gynecology

## 2017-09-24 ENCOUNTER — Encounter (HOSPITAL_COMMUNITY): Payer: Self-pay | Admitting: *Deleted

## 2017-09-24 DIAGNOSIS — R42 Dizziness and giddiness: Secondary | ICD-10-CM | POA: Insufficient documentation

## 2017-09-24 DIAGNOSIS — N921 Excessive and frequent menstruation with irregular cycle: Secondary | ICD-10-CM

## 2017-09-24 DIAGNOSIS — N941 Unspecified dyspareunia: Secondary | ICD-10-CM | POA: Diagnosis not present

## 2017-09-24 DIAGNOSIS — N939 Abnormal uterine and vaginal bleeding, unspecified: Secondary | ICD-10-CM | POA: Insufficient documentation

## 2017-09-24 DIAGNOSIS — F17200 Nicotine dependence, unspecified, uncomplicated: Secondary | ICD-10-CM | POA: Insufficient documentation

## 2017-09-24 DIAGNOSIS — Z975 Presence of (intrauterine) contraceptive device: Secondary | ICD-10-CM

## 2017-09-24 DIAGNOSIS — N946 Dysmenorrhea, unspecified: Secondary | ICD-10-CM | POA: Diagnosis not present

## 2017-09-24 HISTORY — DX: Tachycardia, unspecified: R00.0

## 2017-09-24 HISTORY — DX: Anemia, unspecified: D64.9

## 2017-09-24 LAB — WET PREP, GENITAL
Clue Cells Wet Prep HPF POC: NONE SEEN
SPERM: NONE SEEN
Trich, Wet Prep: NONE SEEN
YEAST WET PREP: NONE SEEN

## 2017-09-24 LAB — URINALYSIS, ROUTINE W REFLEX MICROSCOPIC
BILIRUBIN URINE: NEGATIVE
GLUCOSE, UA: NEGATIVE mg/dL
KETONES UR: NEGATIVE mg/dL
LEUKOCYTES UA: NEGATIVE
Nitrite: NEGATIVE
PH: 7 (ref 5.0–8.0)
Protein, ur: NEGATIVE mg/dL
SPECIFIC GRAVITY, URINE: 1.013 (ref 1.005–1.030)

## 2017-09-24 LAB — CBC
HCT: 34.6 % — ABNORMAL LOW (ref 36.0–46.0)
HEMOGLOBIN: 11.7 g/dL — AB (ref 12.0–15.0)
MCH: 24.3 pg — AB (ref 26.0–34.0)
MCHC: 33.8 g/dL (ref 30.0–36.0)
MCV: 71.8 fL — AB (ref 78.0–100.0)
Platelets: 238 10*3/uL (ref 150–400)
RBC: 4.82 MIL/uL (ref 3.87–5.11)
RDW: 15.1 % (ref 11.5–15.5)
WBC: 5.4 10*3/uL (ref 4.0–10.5)

## 2017-09-24 MED ORDER — IBUPROFEN 600 MG PO TABS: 600.0000 mg | ORAL_TABLET | Freq: Four times a day (QID) | ORAL | 1 refills | Status: DC | PRN

## 2017-09-24 MED ORDER — MEDROXYPROGESTERONE ACETATE 10 MG PO TABS: 10.0000 mg | ORAL_TABLET | Freq: Every day | ORAL | 1 refills | Status: DC

## 2017-09-24 NOTE — MAU Note (Signed)
Having lower abdominal cramping, started bleeding heavy last night, changing pads every 1 1/2 hours, passing clots, 2 weeks early for period

## 2017-09-24 NOTE — Discharge Instructions (Signed)
Dyspareunia, Female Dyspareunia is pain that is associated with sexual activity. This can affect any part of the genitals or lower abdomen, and there are many possible causes. This condition ranges from mild to severe. Depending on the cause, dyspareunia may get better with treatment, or it may return (recur) over time. What are the causes? The cause of this condition is not always known. Possible causes include:   Psychological factors, such as depression, anxiety, or previous traumatic experiences.  Severe pain and tenderness of the skin around the vagina (vulva) when it is touched (vulvar vestibulitis syndrome).  Infection of the pelvis or the vulva.  Infection of the vagina.  Painful, involuntary tightening (contraction) of the vaginal muscles when anything is put inside the vagina (vaginismus).  Allergic reaction.  Ovarian cysts.  Solid growths of tissue (tumors) in the ovaries or the uterus.  Scar tissue in the ovaries, vagina, or pelvis.  Vaginal dryness.  Thinning of the tissue (atrophy) of the vulva and vagina.  Skin conditions that affect the vulva (vulvar dermatoses), such as lichen sclerosus or lichen planus.  Endometriosis.  Tubal pregnancy.  A tilted uterus.  Uterine prolapse.  Adhesions in the vagina.  Bladder problems.  Intestinal problems.  Certain medicines.  Medical conditions such as diabetes, arthritis, or thyroid disease.  Cancer.  What increases the risk? The following factors may make you more likely to develop this condition:  Having experienced physical or sexual trauma.  Having given birth more than once.  Taking birth control pills.  Having gone through menopause.  Having recently given birth, typically within the past 3-6 months.  Breastfeeding.  What are the signs or symptoms? The main symptom of this condition is pain in any part of the genitals or lower abdomen during or after sexual activity. This may include pain  during sexual arousal, genital stimulation, or orgasm. Pain may get worse when anything is inserted into the vagina, or when the genitals are touched in any way, such as when sitting or wearing pants. Pain can range from mild to severe, depending on the cause of the condition. In some cases, symptoms go away with treatment and return (recur) at a later date. How is this diagnosed? This condition may be diagnosed based on:  Your symptoms, including: ? Where your pain is located. ? When your pain occurs.  Your medical history.  A physical exam. This may include a pelvic exam and a Pap test. This is a screening test that is used to check for signs of cancer of the vagina, cervix, and uterus.  Tests, including: ? Blood tests. ? Ultrasound. This uses sound waves to make a picture of the area that is being tested. ? Urine culture. This test involves checking a urine sample for signs of infection. ? Culture test. This is when your health care provider uses a swab to collect a sample of vaginal fluid. The sample is checked for signs of infection. ? X-rays. ? MRI. ? CT scan. ? Laparoscopy. This is a procedure in which a small incision is made in your lower abdomen and a lighted, pencil-sized instrument (laparoscope) is passed through the incision and used to look inside your pelvis.  You may be referred to a health care provider who specializes in women's health (gynecologist). In some cases, diagnosing the cause of dyspareunia can be difficult. How is this treated? Treatment depends on the cause of your condition and your symptoms. In most cases, you may need to stop sexual activity until your  symptoms improve. Treatment may include:  Lubricants.  Kegel exercises or vaginal dilators.  Medicated skin creams.  Medicated vaginal creams.  Hormonal therapy.  Antibiotic medicine to prevent or fight infection.  Medicines that help to relieve pain.  Medicines that treat depression  (antidepressants).  Psychological counseling.  Sex therapy.  Surgery.  Follow these instructions at home: Lifestyle  Avoid tight clothing and irritating materials around your genital and abdominal area.  Use water-based lubricants as needed. Avoid oil-based lubricants.  Do not use any products that irritate you. This may include certain condoms, spermicides, lubricants, soaps, tampons, vaginal sprays, or douches.  Always practice safe sex. Talk with your health care provider about which form of birth control (contraception) is best for you.  Maintain open communication with your sexual partner. General instructions  Take over-the-counter and prescription medicines only as told by your health care provider.  If you had tests done, it is your responsibility to get your tests results. Ask your health care provider or the department performing the test when your results will be ready.  Urinate before you engage in sexual activity.  Consider joining a support group.  Keep all follow-up visits as told by your health care provider. This is important. Contact a health care provider if:  You develop vaginal bleeding after sexual intercourse.  You develop a lump at the opening of your vagina. Seek medical care even if the lump is painless.  You have: ? Abnormal vaginal discharge. ? Vaginal dryness. ? Itchiness or irritation of your vulva or vagina. ? A new rash. ? Symptoms that get worse or do not improve with treatment. ? A fever. ? Pain when you urinate. ? Blood in your urine. Get help right away if:  You develop severe pain in your abdomen during or shortly after sexual intercourse.  You pass out after having sexual intercourse. This information is not intended to replace advice given to you by your health care provider. Make sure you discuss any questions you have with your health care provider. Document Released: 11/08/2007 Document Revised: 02/28/2016 Document  Reviewed: 05/21/2015 Elsevier Interactive Patient Education  [employer].

## 2017-09-24 NOTE — MAU Provider Note (Signed)
Chief Complaint: Vaginal Bleeding; Abdominal Pain; and Dizziness   First Provider Initiated Contact with Patient 09/24/17 1424      SUBJECTIVE HPI: Deborah Weber is a 21 y.o. G0P0 who presents to maternity admissions reporting onset of heavy menstrual bleeding and menstrual cramping yesterday.  She also reports some pain with intercourse for at least 1 week before now.  She has not tried any treatments. There are no other associated symptoms. She uses Nexplanon for birth control, has had this method for 5 years with the second one placed 2 years ago.   She denies vaginal itching/burning, urinary symptoms, h/a, dizziness, n/v, or fever/chills.     HPI  Past Medical History:  Diagnosis Date  . Anemia   . 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 Gastritis   . Tachycardia    Past Surgical History:  Procedure Laterality Date  . ANKLE SURGERY Left   . BREAST BIOPSY Right 2013   excision fibroadenoma   Social History   Socioeconomic History  . Marital status: Single    Spouse name: Not on file  . Number of children: Not on file  . Years of education: Not on file  . Highest education level: Not on file  Social Needs  . Financial resource strain: Not on file  . Food insecurity - worry: Not on file  . Food insecurity - inability: Not on file  . Transportation needs - medical: Not on file  . Transportation needs - non-medical: Not on file  Occupational History  . Not on file  Tobacco Use  . Smoking status: Current Some Day Smoker  . Smokeless tobacco: Never Used  Substance and Sexual Activity  . Alcohol use: Yes    Comment: occasionally  . Drug use: No  . Sexual activity: Yes    Birth control/protection: Implant  Other Topics Concern  . Not on file  Social History Narrative  . Not on file   No current facility-administered medications on file prior to encounter.    Current Outpatient Medications on File Prior to Encounter  Medication Sig Dispense  Refill  . acetaminophen (TYLENOL) 500 MG tablet Take 500 mg by mouth every 6 (six) hours as needed for mild pain or headache.    Marland Kitchen amitriptyline (ELAVIL) 25 MG tablet Take by mouth.    . pantoprazole (PROTONIX) 40 MG tablet Take 40 mg by mouth daily.    . SUMAtriptan (IMITREX) 50 MG tablet Take 50 mg by mouth every 2 (two) hours as needed for migraine. May repeat in 2 hours if headache persists or recurs.    Marland Kitchen EPINEPHrine 0.3 mg/0.3 mL IJ SOAJ injection Inject 0.3 mg into the muscle as needed.    . etonogestrel (NEXPLANON) 68 MG IMPL implant Inject into the skin.     No Known Allergies  ROS:  Review of Systems  Constitutional: Negative for chills, fatigue and fever.  Respiratory: Negative for shortness of breath.   Cardiovascular: Negative for chest pain.  Gastrointestinal: Positive for abdominal pain.  Genitourinary: Positive for pelvic pain and vaginal bleeding. Negative for difficulty urinating, dysuria, flank pain, vaginal discharge and vaginal pain.  Neurological: Positive for dizziness. Negative for headaches.  Psychiatric/Behavioral: Negative.      I have reviewed patient's Past Medical Hx, Surgical Hx, Family Hx, Social Hx, medications and allergies.   Physical Exam   Patient Vitals for the past 24 hrs:  BP Temp Pulse Resp  09/24/17 1618 110/70 - 89 18  09/24/17 1135 113/64 99.1 F (37.3 C) 82 16   Constitutional: Well-developed, well-nourished female in no acute distress.  HEART: normal rate, heart sounds, regular rhythm RESP: normal effort, lung sounds clear and equal bilaterally GI: Abd soft, non-tender. Pos BS x 4 MS: Extremities nontender, no edema, normal ROM Neurologic: Alert and oriented x 4.  GU: Neg CVAT.  PELVIC EXAM: Cervix pink, visually closed, without lesion, small amount dark red bleeding with small clots, vaginal walls and external genitalia normal Bimanual exam: Cervix 0/long/high, firm, anterior, neg CMT, uterus mildly tender, nonenlarged, adnexa  without tenderness, enlargement, or mass   LAB RESULTS Results for orders placed or performed during the hospital encounter of 09/24/17 (from the past 24 hour(s))  Urinalysis, Routine w reflex microscopic     Status: Abnormal   Collection Time: 09/24/17 11:30 AM  Result Value Ref Range   Color, Urine STRAW (A) YELLOW   APPearance CLEAR CLEAR   Specific Gravity, Urine 1.013 1.005 - 1.030   pH 7.0 5.0 - 8.0   Glucose, UA NEGATIVE NEGATIVE mg/dL   Hgb urine dipstick LARGE (A) NEGATIVE   Bilirubin Urine NEGATIVE NEGATIVE   Ketones, ur NEGATIVE NEGATIVE mg/dL   Protein, ur NEGATIVE NEGATIVE mg/dL   Nitrite NEGATIVE NEGATIVE   Leukocytes, UA NEGATIVE NEGATIVE   RBC / HPF 6-30 0 - 5 RBC/hpf   WBC, UA 0-5 0 - 5 WBC/hpf   Bacteria, UA RARE (A) NONE SEEN   Squamous Epithelial / LPF 0-5 (A) NONE SEEN   Mucus PRESENT   CBC     Status: Abnormal   Collection Time: 09/24/17  2:31 PM  Result Value Ref Range   WBC 5.4 4.0 - 10.5 K/uL   RBC 4.82 3.87 - 5.11 MIL/uL   Hemoglobin 11.7 (L) 12.0 - 15.0 g/dL   HCT 34.6 (L) 36.0 - 46.0 %   MCV 71.8 (L) 78.0 - 100.0 fL   MCH 24.3 (L) 26.0 - 34.0 pg   MCHC 33.8 30.0 - 36.0 g/dL   RDW 15.1 11.5 - 15.5 %   Platelets 238 150 - 400 K/uL  Wet prep, genital     Status: Abnormal   Collection Time: 09/24/17  2:42 PM  Result Value Ref Range   Yeast Wet Prep HPF POC NONE SEEN NONE SEEN   Trich, Wet Prep NONE SEEN NONE SEEN   Clue Cells Wet Prep HPF POC NONE SEEN NONE SEEN   WBC, Wet Prep HPF POC FEW (A) NONE SEEN   Sperm NONE SEEN        IMAGING No results found.  MAU Management/MDM: Ordered labs and reviewed results. Mild anemia with Hgb 11.7. Likely AUB related to Nexplanon.  Will treat with Provera 10 mg daily x 10 days, expect to have bleeding after medication is completed.  Ibuprofen for bleeding/cramping and can take before intercourse to reduce pain.  Cannot give OCPs because pt has hx of blood clot in arm. Pt f/u with her Gyn provider in  [place] to evaluate dyspareunia and irregular bleeding if these persist.  Pt to return to MAU if dizziness persists/worsens or other emergencies.  Pt discharged with strict bleeding precautions.  ASSESSMENT 1. Abnormal uterine bleeding (AUB)   2. Breakthrough bleeding on Nexplanon   3. Dyspareunia in female   4. Dysmenorrhea     PLAN Discharge home Allergies as of 09/24/2017   No Known Allergies     Medication List    TAKE these medications   acetaminophen 500 MG tablet  Commonly known as:  TYLENOL Take 500 mg by mouth every 6 (six) hours as needed for mild pain or headache.   amitriptyline 25 MG tablet Commonly known as:  ELAVIL Take by mouth.   EPINEPHrine 0.3 mg/0.3 mL Soaj injection Commonly known as:  EPI-PEN Inject 0.3 mg into the muscle as needed.   etonogestrel 68 MG Impl implant Commonly known as:  NEXPLANON Inject into the skin.   ibuprofen 600 MG tablet Commonly known as:  ADVIL,MOTRIN Take 1 tablet (600 mg total) by mouth every 6 (six) hours as needed.   medroxyPROGESTERone 10 MG tablet Commonly known as:  PROVERA Take 1 tablet (10 mg total) by mouth daily. Use for ten days   pantoprazole 40 MG tablet Commonly known as:  PROTONIX Take 40 mg by mouth daily.   SUMAtriptan 50 MG tablet Commonly known as:  IMITREX Take 50 mg by mouth every 2 (two) hours as needed for migraine. May repeat in 2 hours if headache persists or recurs.      Follow-up Information    Your Gyn provider in [place] Follow up.   Why:  Return to MAU as needed for emergencies          Fatima Blank Certified Nurse-Midwife 09/24/2017  7:05 PM

## 2017-09-27 LAB — GC/CHLAMYDIA PROBE AMP (~~LOC~~) NOT AT ARMC
CHLAMYDIA, DNA PROBE: NEGATIVE
NEISSERIA GONORRHEA: NEGATIVE

## 2017-10-22 ENCOUNTER — Ambulatory Visit (INDEPENDENT_AMBULATORY_CARE_PROVIDER_SITE_OTHER): Payer: BC Managed Care – PPO | Admitting: Obstetrics and Gynecology

## 2017-10-22 VITALS — BP 122/70 | Ht 68.0 in | Wt 178.0 lb

## 2017-10-22 DIAGNOSIS — N920 Excessive and frequent menstruation with regular cycle: Secondary | ICD-10-CM | POA: Diagnosis not present

## 2017-10-22 DIAGNOSIS — N946 Dysmenorrhea, unspecified: Secondary | ICD-10-CM | POA: Diagnosis not present

## 2017-10-22 NOTE — Progress Notes (Signed)
Obstetrics & Gynecology Office Visit   Chief Complaint  Patient presents with  . Follow-up  . Endometriosis   History of Present Illness: 21 y.o. G0P0 female who presents for a history of cramping and bleeding for the past three months.  She states that she has a menses every 25 days, that lasts for eight days (prior to when this all started three months ago her menses would last for days).  She states this started three months ago. Her bleeding is so heavy that she has to change her pad every hour during the heavy days.  Prior to three months ago she had regular menses (the timing of her menses has not changed) that was not painful.  Now she has terrible cramps with her periods.  She denies any other changes in her medical history.  She states that she had a blood clot in her arm two years ago.  She is unsure of the medication that she took for this, but it does not sound like coumadin, as she only took the medication for a short while.  This occurred while she was in [place].  She does not have the medical records from this problem.  She denies fevers, chills, and changes in her weight.  She is not pregnant.   History of chlamydia earlier this year that was treated and she had a negative test of cure.  She states her last pap smear was earlier this year and was normal.    Past Medical History:  Diagnosis Date  . Anemia   . 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 Gastritis   . Tachycardia     Past Surgical History:  Procedure Laterality Date  . ANKLE SURGERY Left   . BREAST BIOPSY Right 2013   excision fibroadenoma    Gynecologic History: Patient's last menstrual period was 09/17/2017.  Obstetric History: G0P0  Family History  Problem Relation Age of Onset  . Colon cancer Paternal Aunt 59  . Breast cancer Paternal Grandmother 49    Social History   Socioeconomic History  . Marital status: Single    Spouse name: Not on file  . Number of children: Not  on file  . Years of education: Not on file  . Highest education level: Not on file  Social Needs  . Financial resource strain: Not on file  . Food insecurity - worry: Not on file  . Food insecurity - inability: Not on file  . Transportation needs - medical: Not on file  . Transportation needs - non-medical: Not on file  Occupational History  . Not on file  Tobacco Use  . Smoking status: Current Some Day Smoker  . Smokeless tobacco: Never Used  Substance and Sexual Activity  . Alcohol use: Yes    Comment: occasionally  . Drug use: No  . Sexual activity: Yes    Birth control/protection: Implant  Other Topics Concern  . Not on file  Social History Narrative  . Not on file   Allergies: No Known Allergies  Medications   Medication Sig Start Date End Date Taking? Authorizing Provider  acetaminophen (TYLENOL) 500 MG tablet Take 500 mg by mouth every 6 (six) hours as needed for mild pain or headache.   Yes [provider]  amitriptyline (ELAVIL) 25 MG tablet Take by mouth. 07/28/17  Yes [provider]  EPINEPHrine 0.3 mg/0.3 mL IJ SOAJ injection Inject 0.3 mg into the muscle as needed. 09/07/16  Yes [provider]  etonogestrel (NEXPLANON) 68 MG IMPL implant Inject into the skin.   Yes [provider]  ibuprofen (ADVIL,MOTRIN) 600 MG tablet Take 1 tablet (600 mg total) by mouth every 6 (six) hours as needed. 09/24/17  Yes Leftwich-Kirby, Kathie Dike, CNM  medroxyPROGESTERone (PROVERA) 10 MG tablet Take 1 tablet (10 mg total) by mouth daily. Use for ten days 09/24/17  Yes Leftwich-Kirby, Kathie Dike, CNM  pantoprazole (PROTONIX) 40 MG tablet Take 40 mg by mouth daily.   Yes [provider]  SUMAtriptan (IMITREX) 50 MG tablet Take 50 mg by mouth every 2 (two) hours as needed for migraine. May repeat in 2 hours if headache persists or recurs.   Yes [provider]    Review of Systems  Constitutional: Negative.   HENT: Negative.   Eyes:  Negative.   Respiratory: Negative.   Cardiovascular: Negative.   Gastrointestinal: Negative.   Genitourinary: Negative.   Musculoskeletal: Negative.   Skin: Negative.   Neurological: Negative.   Psychiatric/Behavioral: Negative.      Physical Exam BP 122/70   Ht 5\' 8"  (1.727 m)   Wt 178 lb (80.7 kg)   LMP 09/17/2017   BMI 27.06 kg/m  Patient's last menstrual period was 09/17/2017. Physical Exam  Constitutional: She is oriented to person, place, and time. She appears well-developed and well-nourished. No distress.  Genitourinary: Vagina normal and uterus normal. Pelvic exam was performed with patient supine. There is no rash, tenderness or lesion on the right labia. There is no rash, tenderness or lesion on the left labia. Right adnexum does not display mass, does not display tenderness and does not display fullness. Left adnexum does not display mass, does not display tenderness and does not display fullness. Cervix does not exhibit motion tenderness, lesion or polyp.  HENT:  Head: Normocephalic and atraumatic.  Eyes: EOM are normal. No scleral icterus.  Neck: Normal range of motion. Neck supple. No thyromegaly present.  Cardiovascular: Normal rate and regular rhythm.  Pulmonary/Chest: Effort normal and breath sounds normal. No respiratory distress. She has no wheezes. She has no rales.  Abdominal: Soft. Bowel sounds are normal. She exhibits no distension and no mass. There is no tenderness. There is no rebound and no guarding.  Musculoskeletal: Normal range of motion. She exhibits no edema.  Lymphadenopathy:    She has no cervical adenopathy.  Neurological: She is alert and oriented to person, place, and time. No cranial nerve deficit.  Skin: Skin is warm and dry. No erythema.  Psychiatric: She has a normal mood and affect. Her behavior is normal. Judgment normal.   Female chaperone present for pelvic and breast  portions of the physical exam  Assessment: 21 y.o. G0P0 female  here for  1. Menorrhagia with regular cycle   2. Dysmenorrhea      Plan: Problem List Items Addressed This Visit    None    Visit Diagnoses    Menorrhagia with regular cycle    -  Primary   Relevant Orders   US PELVIS TRANSVANGINAL NON-OB (TV ONLY)   Dysmenorrhea       Relevant Orders   US PELVIS TRANSVANGINAL NON-OB (TV ONLY)    Discussed multiple possible etiologies to her bleeding. She is taking Provera when she has a period. She was seen in the ER for this on 11/23.  SHe had a negative gonorrhea and chlamydia test and a negative wet prep.  Workup to be based on [place] approach to heavy bleeding.  Will obtain  a pelvic ultrasound as a next step. She currently has a Nexplanon for contraception.  She states she will obtain her old records to see if she truly had a blood clot in the past.  Until that time can not use estrogen-containing products.   Prentice Docker, MD 10/25/2017 8:33 AM

## 2017-10-25 ENCOUNTER — Encounter: Payer: Self-pay | Admitting: Obstetrics and Gynecology

## 2017-10-29 ENCOUNTER — Ambulatory Visit (INDEPENDENT_AMBULATORY_CARE_PROVIDER_SITE_OTHER): Payer: BC Managed Care – PPO | Admitting: Obstetrics and Gynecology

## 2017-10-29 ENCOUNTER — Ambulatory Visit (INDEPENDENT_AMBULATORY_CARE_PROVIDER_SITE_OTHER): Payer: BC Managed Care – PPO

## 2017-10-29 VITALS — BP 124/78 | Ht 69.0 in | Wt 178.0 lb

## 2017-10-29 DIAGNOSIS — N946 Dysmenorrhea, unspecified: Secondary | ICD-10-CM

## 2017-10-29 DIAGNOSIS — N920 Excessive and frequent menstruation with regular cycle: Secondary | ICD-10-CM | POA: Diagnosis not present

## 2017-10-29 NOTE — Progress Notes (Signed)
Gynecology Ultrasound Follow Up   Chief Complaint  Patient presents with  . Follow-up  heavy menstrual bleeding, dysmenorrhea   History of Present Illness: Patient is a 21 y.o. female who presents today for ultrasound evaluation of the above .  I have personally reviewed the images and report for this ultrasound and my interpretation is reflected below.  Adnexa: right ovarian cyst measuring 2.3 x 2.0 cm Uterus: anteverted with endometrial stripe  3.2 mm Additional: none  Her current bleeding is tolerable. She just finished a bleeding episode a few days ago.  She also mentioned that she looked up her records from her time in [place] and her diagnosis does state "blood clot."  She was not recommended any particular follow up and no work up was done.    Past Medical History:  Diagnosis Date  . Anemia   . 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 Gastritis   . Migraine   . Tachycardia     Past Surgical History:  Procedure Laterality Date  . ANKLE SURGERY Left   . BREAST BIOPSY Right 2013   excision fibroadenoma    Family History  Problem Relation Age of Onset  . Colon cancer Paternal Aunt 71  . Breast cancer Paternal Grandmother 51   Social History   Socioeconomic History  . Marital status: Single    Spouse name: Not on file  . Number of children: Not on file  . Years of education: Not on file  . Highest education level: Not on file  Social Needs  . Financial resource strain: Not on file  . Food insecurity - worry: Not on file  . Food insecurity - inability: Not on file  . Transportation needs - medical: Not on file  . Transportation needs - non-medical: Not on file  Occupational History  . Not on file  Tobacco Use  . Smoking status: Current Some Day Smoker  . Smokeless tobacco: Never Used  Substance and Sexual Activity  . Alcohol use: Yes    Comment: occasionally  . Drug use: No  . Sexual activity: Yes    Birth control/protection:  Implant  Other Topics Concern  . Not on file  Social History Narrative  . Not on file   Allergies: No Known Allergies  Prior to Admission medications   Medication Sig Start Date End Date Taking? Authorizing Provider  acetaminophen (TYLENOL) 500 MG tablet Take 500 mg by mouth every 6 (six) hours as needed for mild pain or headache.    [provider]  amitriptyline (ELAVIL) 25 MG tablet Take by mouth. 07/28/17   [provider]  EPINEPHrine 0.3 mg/0.3 mL IJ SOAJ injection Inject 0.3 mg into the muscle as needed. 09/07/16   [provider]  etonogestrel (NEXPLANON) 68 MG IMPL implant Inject into the skin.    [provider]  ibuprofen (ADVIL,MOTRIN) 600 MG tablet Take 1 tablet (600 mg total) by mouth every 6 (six) hours as needed. 09/24/17   Leftwich-Kirby, Kathie Dike, CNM  medroxyPROGESTERone (PROVERA) 10 MG tablet Take 1 tablet (10 mg total) by mouth daily. Use for ten days 09/24/17   Fatima Blank A, CNM  pantoprazole (PROTONIX) 40 MG tablet Take 40 mg by mouth daily.    [provider]  SUMAtriptan (IMITREX) 50 MG tablet Take 50 mg by mouth every 2 (two) hours as needed for migraine. May repeat in 2 hours if headache persists or recurs.    [provider]  Physical Exam BP 124/78   Ht 5\' 9"  (1.753 m)   Wt 178 lb (80.7 kg)   BMI 26.29 kg/m    General: NAD HEENT: normocephalic, anicteric Pulmonary: No increased work of breathing Extremities: no edema, erythema, or tenderness Neurologic: Grossly intact, normal gait Psychiatric: mood appropriate, affect full   Assessment: 21 y.o. G0P0 with  1. Menorrhagia with regular cycle   2. dysmenorrhea      Plan: Problem List Items Addressed This Visit    dysmenorrhea - Primary   Relevant Medications   celecoxib (CELEBREX) 200 MG capsule     25 minutes spent in face to face discussion with > 50% spent in counseling, management, and coordination of care of her menorrhagia with  regular cycle and dysmenorrhea.  Based on all the information I have been able to gather, she may benefit from add-back estrogen.  However, she has two contraindications to exogenous estrogen therapy, migraine headaches and a history of what sounds like some sort of thromboembolism, possibly superficial, if left untreated for longer term.   She also smokes occasionally.  Therefore, I am not comfortable giving her exogenous estrogen therapy. The only way I can give her an increase estrogen effect to her uterus is to remove the source of suppression of estrogen effect from her uterus by removing her Nexplanon.  She is sexually active and does not have the option of using combined estrogen-progesterone contraception.  PRN progesterone therapy may be beneficial to her, but with a very thin endometria stripe, this may not be as effective, but certainly worth trying.  Alternative contraception was discussed in the form of non-hormonal forms, abstinence, condoms, and a copper intrauterine device.  Since this has been going on for only a couple months, things may improve on their own as some environmental or psycho-social factor may be involved. After a very long discussion, she opts to wait another 1-2 months to see how her menses go.  I will provide her a prescription for celebrex to be taken just before she starts her menses or when cramping begins.  She has a history of gastritis. So, I did not want her to use a non-selective NSAID.  I did recommend a work up for hypercoagulable states.  Even still, if her bleeding continues consideration for bleeding diathesis like von Willebrand Disease should be considered.  If her heavy and painful menses continues, will repeat ultrasound and remove her Nexplanon to give her back endogenous estrogen effect.  All questions answered.    Prentice Docker, MD 10/31/2017 12:13 PM

## 2017-10-31 ENCOUNTER — Encounter: Payer: Self-pay | Admitting: Obstetrics and Gynecology

## 2017-10-31 DIAGNOSIS — N920 Excessive and frequent menstruation with regular cycle: Secondary | ICD-10-CM | POA: Insufficient documentation

## 2017-10-31 MED ORDER — CELECOXIB 200 MG PO CAPS: 200.0000 mg | ORAL_CAPSULE | Freq: Two times a day (BID) | ORAL | 0 refills | Status: DC | PRN

## 2017-11-04 ENCOUNTER — Ambulatory Visit: Payer: BC Managed Care – PPO | Admitting: Obstetrics and Gynecology

## 2017-11-09 ENCOUNTER — Emergency Department (HOSPITAL_COMMUNITY): Payer: Managed Care, Other (non HMO)

## 2017-11-09 ENCOUNTER — Emergency Department (HOSPITAL_COMMUNITY)
Admission: EM | Admit: 2017-11-09 | Discharge: 2017-11-09 | Disposition: A | Discharge status: Home / Self Care | Payer: Managed Care, Other (non HMO) | Attending: Emergency Medicine | Admitting: Emergency Medicine

## 2017-11-09 ENCOUNTER — Encounter (HOSPITAL_COMMUNITY): Payer: Self-pay | Admitting: *Deleted

## 2017-11-09 DIAGNOSIS — R1031 Right lower quadrant pain: Secondary | ICD-10-CM | POA: Diagnosis not present

## 2017-11-09 DIAGNOSIS — R112 Nausea with vomiting, unspecified: Secondary | ICD-10-CM | POA: Diagnosis present

## 2017-11-09 DIAGNOSIS — F17210 Nicotine dependence, cigarettes, uncomplicated: Secondary | ICD-10-CM | POA: Diagnosis not present

## 2017-11-09 DIAGNOSIS — Z79899 Other long term (current) drug therapy: Secondary | ICD-10-CM | POA: Insufficient documentation

## 2017-11-09 DIAGNOSIS — R197 Diarrhea, unspecified: Secondary | ICD-10-CM | POA: Insufficient documentation

## 2017-11-09 LAB — CBC
HCT: 34.2 % — ABNORMAL LOW (ref 36.0–46.0)
HEMOGLOBIN: 11.4 g/dL — AB (ref 12.0–15.0)
MCH: 23.6 pg — AB (ref 26.0–34.0)
MCHC: 33.3 g/dL (ref 30.0–36.0)
MCV: 70.7 fL — AB (ref 78.0–100.0)
PLATELETS: 235 10*3/uL (ref 150–400)
RBC: 4.84 MIL/uL (ref 3.87–5.11)
RDW: 14.8 % (ref 11.5–15.5)
WBC: 4.1 10*3/uL (ref 4.0–10.5)

## 2017-11-09 LAB — URINALYSIS, ROUTINE W REFLEX MICROSCOPIC
BILIRUBIN URINE: NEGATIVE
GLUCOSE, UA: NEGATIVE mg/dL
HGB URINE DIPSTICK: NEGATIVE
Ketones, ur: NEGATIVE mg/dL
Leukocytes, UA: NEGATIVE
Nitrite: NEGATIVE
Protein, ur: NEGATIVE mg/dL
SPECIFIC GRAVITY, URINE: 1.012 (ref 1.005–1.030)
pH: 7 (ref 5.0–8.0)

## 2017-11-09 LAB — I-STAT BETA HCG BLOOD, ED (MC, WL, AP ONLY)

## 2017-11-09 LAB — COMPREHENSIVE METABOLIC PANEL
ALBUMIN: 3.6 g/dL (ref 3.5–5.0)
ALK PHOS: 52 U/L (ref 38–126)
ALT: 21 U/L (ref 14–54)
AST: 19 U/L (ref 15–41)
Anion gap: 7 (ref 5–15)
BUN: 7 mg/dL (ref 6–20)
CALCIUM: 8.9 mg/dL (ref 8.9–10.3)
CHLORIDE: 103 mmol/L (ref 101–111)
CO2: 24 mmol/L (ref 22–32)
CREATININE: 0.57 mg/dL (ref 0.44–1.00)
GFR calc Af Amer: >60 mL/min (ref >60)
GFR calc non Af Amer: >60 mL/min (ref >60)
GLUCOSE: 79 mg/dL (ref 65–99)
Potassium: 3.8 mmol/L (ref 3.5–5.1)
SODIUM: 134 mmol/L — AB (ref 135–145)
Total Bilirubin: 0.8 mg/dL (ref 0.3–1.2)
Total Protein: 7.4 g/dL (ref 6.5–8.1)

## 2017-11-09 LAB — LIPASE, BLOOD: Lipase: 26 U/L (ref 11–51)

## 2017-11-09 MED ORDER — ONDANSETRON 4 MG PO TBDP: 4.0000 mg | ORAL_TABLET | Freq: Three times a day (TID) | ORAL | 0 refills | Status: DC | PRN

## 2017-11-09 MED ORDER — DICYCLOMINE HCL 20 MG PO TABS: 20.0000 mg | ORAL_TABLET | Freq: Two times a day (BID) | ORAL | 0 refills | Status: DC

## 2017-11-09 MED ORDER — SODIUM CHLORIDE 0.9 % IV BOLUS (SEPSIS)
1000.0000 mL | Freq: Once | INTRAVENOUS | Status: AC
  Administered 2017-11-09: 1000 mL via INTRAVENOUS

## 2017-11-09 MED ORDER — LANSOPRAZOLE 15 MG PO CPDR: 15.0000 mg | DELAYED_RELEASE_CAPSULE | Freq: Every day | ORAL | 0 refills | Status: DC

## 2017-11-09 MED ORDER — MORPHINE SULFATE (PF) 4 MG/ML IV SOLN
4.0000 mg | Freq: Once | INTRAVENOUS | Status: AC
Start: 2017-11-09 — End: 2017-11-09
  Administered 2017-11-09: 4 mg via INTRAVENOUS
  Filled 2017-11-09: qty 1

## 2017-11-09 MED ORDER — ONDANSETRON HCL 4 MG/2ML IJ SOLN
4.0000 mg | Freq: Once | INTRAMUSCULAR | Status: AC
  Administered 2017-11-09: 4 mg via INTRAVENOUS
  Filled 2017-11-09: qty 2

## 2017-11-09 MED ORDER — IOPAMIDOL (ISOVUE-300) INJECTION 61%
INTRAVENOUS | Status: AC
  Administered 2017-11-09: 100 mL
  Filled 2017-11-09: qty 100

## 2017-11-09 NOTE — ED Notes (Signed)
Pt to CT at this time.

## 2017-11-09 NOTE — ED Triage Notes (Signed)
Pt reports abdominal pain with N/V/D for 5 days. Pt reports recent issues with same and gastritis diagnosis. Pt also reports a known cyst on her ovary.

## 2017-11-09 NOTE — ED Provider Notes (Signed)
[facility] Provider Note   CSN: [id number] Arrival date & time: 11/09/17  1057     History   Chief Complaint Chief Complaint  Patient presents with  . Abdominal Pain    HPI [facility] is a 22 y.o. female.  [facility] is a 22 y.o. Female who presents to the emergency department complaining of right-sided abdominal pain with associated nausea, vomiting and diarrhea for the past 5 days.  Patient reports for 5 days she has been having right lower quadrant abdominal pain that sometimes radiates to her suprapubic region and up into her right flank.  She reports is been ongoing for 5 days and is been associate with nausea, vomiting or diarrhea.  She reports one episode of vomiting today.  She has had no diarrhea today.  She is previously seen GI at [facility] where she had a CT scan that showed diverticula without evidence of diverticulitis.  Patient also reports some increased frequency of urination.  She denies hematemesis or hematochezia.  No treatments attempted prior to arrival today.  She denies previous abdominal surgeries.  She denies fevers, vaginal bleeding, vaginal discharge, dysuria, hematuria, hematochezia, hematemesis, melena, chest pain, coughing or shortness of breath.   The history is provided by the patient and medical records. No language interpreter was used.  Abdominal Pain   Associated symptoms include diarrhea, nausea and vomiting. Pertinent negatives include fever, dysuria, hematuria and headaches.    Past Medical History:  Diagnosis Date  . Anemia   . 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 Gastritis   . Migraine   . Tachycardia     Patient Active Problem List   Diagnosis Date Noted  . dysmenorrhea 10/31/2017  . History of breast lump/mass excision 07/25/2013  . Fibroadenoma of breast 07/25/2013    Past Surgical History:  Procedure Laterality Date  . ANKLE SURGERY  Left   . BREAST BIOPSY Right 2013   excision fibroadenoma    OB History    Gravida Para Term Preterm AB Living   0             SAB TAB Ectopic Multiple Live Births                  Obstetric Comments   1st Menstrual Cycle:  13  1st Pregnancy:  0       Home Medications    Prior to Admission medications   Medication Sig Start Date End Date Taking? Authorizing Provider  acetaminophen (TYLENOL) 500 MG tablet Take 1,000 mg by mouth every 6 (six) hours as needed for mild pain or headache.    Yes [provider]  etonogestrel (NEXPLANON) 68 MG IMPL implant Inject into the skin.   Yes [provider]  Melatonin 5 MG TABS Take 5 mg by mouth at bedtime.   Yes [provider]  SUMAtriptan (IMITREX) 50 MG tablet Take 50 mg by mouth every 2 (two) hours as needed for migraine. May repeat in 2 hours if headache persists or recurs.   Yes [provider]  celecoxib (CELEBREX) 200 MG capsule Take 1 capsule (200 mg total) by mouth 2 (two) times daily as needed (dysmenorrhea). Day 1 take 2 tabs, may take an another 1 tab day 1, PRN Patient not taking: Reported on 11/09/2017 10/31/17   Will Bonnet, MD  dicyclomine (BENTYL) 20 MG tablet Take 1 tablet (20 mg total) by mouth 2 (two)  times daily. 11/09/17   Waynetta Pean, PA-C  EPINEPHrine 0.3 mg/0.3 mL IJ SOAJ injection Inject 0.3 mg into the muscle as needed. 09/07/16   [provider]  ibuprofen (ADVIL,MOTRIN) 600 MG tablet Take 1 tablet (600 mg total) by mouth every 6 (six) hours as needed. Patient not taking: Reported on 11/09/2017 09/24/17   Elvera Maria, CNM  lansoprazole (PREVACID) 15 MG capsule Take 1 capsule (15 mg total) by mouth daily at 12 noon. 11/09/17   Waynetta Pean, PA-C  medroxyPROGESTERone (PROVERA) 10 MG tablet Take 1 tablet (10 mg total) by mouth daily. Use for ten days Patient not taking: Reported on 11/09/2017 09/24/17   Fatima Blank A, CNM  ondansetron (ZOFRAN ODT) 4 MG  disintegrating tablet Take 1 tablet (4 mg total) by mouth every 8 (eight) hours as needed for nausea or vomiting. 11/09/17   Waynetta Pean, PA-C    Family History Family History  Problem Relation Age of Onset  . Colon cancer Paternal Aunt 32  . Breast cancer Paternal Grandmother 75    Social History Social History   Tobacco Use  . Smoking status: Current Some Day Smoker  . Smokeless tobacco: Never Used  Substance Use Topics  . Alcohol use: Yes    Comment: occasionally  . Drug use: No     Allergies   Ibuprofen   Review of Systems Review of Systems  Constitutional: Negative for chills and fever.  HENT: Negative for congestion and sore throat.   Eyes: Negative for visual disturbance.  Respiratory: Negative for cough, shortness of breath and wheezing.   Cardiovascular: Negative for chest pain.  Gastrointestinal: Positive for abdominal pain, diarrhea, nausea and vomiting. Negative for blood in stool.  Genitourinary: Positive for flank pain. Negative for difficulty urinating, dysuria, hematuria, menstrual problem, pelvic pain, vaginal bleeding and vaginal discharge.  Musculoskeletal: Negative for back pain and neck pain.  Skin: Negative for rash.  Neurological: Negative for headaches.     Physical Exam Updated Vital Signs BP 122/83   Pulse 77   Temp 98.3 F (36.8 C) (Oral)   Resp 16   SpO2 100%   Physical Exam  Constitutional: She appears well-developed and well-nourished.  Non-toxic appearance. She does not appear ill. No distress.  HENT:  Head: Normocephalic and atraumatic.  Mouth/Throat: Oropharynx is clear and moist.  Eyes: Conjunctivae are normal. Pupils are equal, round, and reactive to light. Right eye exhibits no discharge. Left eye exhibits no discharge.  Neck: Neck supple.  Cardiovascular: Normal rate, regular rhythm, normal heart sounds and intact distal pulses. Exam reveals no gallop and no friction rub.  No murmur heard. Pulmonary/Chest: Effort  normal and breath sounds normal. No respiratory distress. She has no wheezes. She has no rales.  Abdominal: Soft. Normal appearance and bowel sounds are normal. There is no tenderness. There is no rigidity, no guarding and no CVA tenderness.  Abdomen is soft.  Bowel sounds are present.  Patient has mild right lower quadrant tenderness to palpation.  No CVA or flank tenderness.  No peritoneal signs.  Musculoskeletal: She exhibits no edema.  Lymphadenopathy:    She has no cervical adenopathy.  Neurological: She is alert. Coordination normal.  Skin: Skin is warm and dry. Capillary refill takes less than 2 seconds. No rash noted. She is not diaphoretic. No erythema. No pallor.  Psychiatric: She has a normal mood and affect. Her behavior is normal.  Nursing note and vitals reviewed.    ED Treatments / Results  Labs (all  labs ordered are listed, but only abnormal results are displayed) Labs Reviewed  COMPREHENSIVE METABOLIC PANEL - Abnormal; Notable for the following components:      Result Value   Sodium 134 (*)    All other components within normal limits  CBC - Abnormal; Notable for the following components:   Hemoglobin 11.4 (*)    HCT 34.2 (*)    MCV 70.7 (*)    MCH 23.6 (*)    All other components within normal limits  LIPASE, BLOOD  URINALYSIS, ROUTINE W REFLEX MICROSCOPIC  I-STAT BETA HCG BLOOD, ED ([facility], [facility], [facility] ONLY)    EKG  EKG Interpretation None       Radiology Ct Abdomen Pelvis W Contrast  Result Date: 11/09/2017 CLINICAL DATA:  Abdominal pain.  Diverticulitis suspected. EXAM: CT ABDOMEN AND PELVIS WITH CONTRAST TECHNIQUE: Multidetector CT imaging of the abdomen and pelvis was performed using the standard protocol following bolus administration of intravenous contrast. CONTRAST:  167mL ISOVUE-300 IOPAMIDOL (ISOVUE-300) INJECTION 61% COMPARISON:  None. FINDINGS: Lower chest: No acute abnormality. Hepatobiliary: No focal liver abnormality is seen. No gallstones,  gallbladder wall thickening, or biliary dilatation. Pancreas: Unremarkable. No pancreatic ductal dilatation or surrounding inflammatory changes. Spleen: Normal in size without focal abnormality. Adrenals/Urinary Tract: The adrenal glands appear normal. Unremarkable appearance of both kidneys. No mass or hydronephrosis. Urinary bladder appears normal. Stomach/Bowel: Stomach is within normal limits. Appendix appears normal. No evidence of bowel wall thickening, distention, or inflammatory changes. Vascular/Lymphatic: Normal appearance of the abdominal aorta. No enlarged retroperitoneal or mesenteric adenopathy. No enlarged pelvic or inguinal lymph nodes. Reproductive: Uterus and bilateral adnexa are unremarkable. Right ovary cyst measures 2.3 cm. Other: Trace free fluid within the posterior pelvis, nonspecific in a premenopausal female. Musculoskeletal: No aggressive lytic or sclerotic bone lesions. IMPRESSION: 1. No acute findings identified within the abdomen or pelvis. No evidence for acute diverticulitis. Electronically Signed   By: Kerby Moors M.D.   On: 11/09/2017 20:09    Procedures Procedures (including critical care time)  Medications Ordered in ED Medications  sodium chloride 0.9 % bolus 1,000 mL (1,000 mLs Intravenous New Bag/Given 11/09/17 1732)  ondansetron (ZOFRAN) injection 4 mg (4 mg Intravenous Given 11/09/17 1734)  morphine 4 MG/ML injection 4 mg (4 mg Intravenous Given 11/09/17 1735)  iopamidol (ISOVUE-300) 61 % injection (100 mLs  Contrast Given 11/09/17 1950)     Initial Impression / Assessment and Plan / ED Course  I have reviewed the triage vital signs and the nursing notes.  Pertinent labs & imaging results that were available during my care of the patient were reviewed by me and considered in my medical decision making (see chart for details).    This is a 22 y.o. Female who presents to the emergency department complaining of right-sided abdominal pain with associated nausea,  vomiting and diarrhea for the past 5 days.  Patient reports for 5 days she has been having right lower quadrant abdominal pain that sometimes radiates to her suprapubic region and up into her right flank.  She reports is been ongoing for 5 days and is been associate with nausea, vomiting or diarrhea.  She reports one episode of vomiting today.  She has had no diarrhea today.  She is previously seen GI at [facility] where she had a CT scan that showed diverticula without evidence of diverticulitis.  Patient also reports some increased frequency of urination.  She denies hematemesis or hematochezia.  No treatments attempted prior to arrival today.  She denies previous abdominal surgeries.  On exam the patient is afebrile nontoxic-appearing.  Abdomen is soft and she has mild right lower quadrant abdominal tenderness to palpation. Pregnancy test is negative.  Urinalysis without sign of infection.  CBC, CMP and lipase are unremarkable. I have low suspicion for ovarian torsion due to her course of her symptoms and exam.  She has no vaginal bleeding or discharge. CT abdomen pelvis with contrast shows no acute findings.  No identifiable cause to her pain.  At reevaluation patient is feeling better.  She is tolerating ginger ale without nausea or vomiting.  She has been having some burping and belching as well.  She is not taking omeprazole, due to making her feel funny.  She would like to try a different PPI.  We will do Prevacid.  We will also discharged with Zofran and Bentyl.  Return precautions discussed. I advised the patient to follow-up with their primary care provider this week. I advised the patient to return to the emergency department with new or worsening symptoms or new concerns. The patient verbalized understanding and agreement with plan.    This patient was discussed with Dr. Vanita Panda who agrees with assessment and plan.   Final Clinical Impressions(s) / ED Diagnoses   Final  diagnoses:  Nausea vomiting and diarrhea  Right lower quadrant abdominal pain    ED Discharge Orders        Ordered    ondansetron (ZOFRAN ODT) 4 MG disintegrating tablet  Every 8 hours PRN     11/09/17 2047    dicyclomine (BENTYL) 20 MG tablet  2 times daily     11/09/17 2047    lansoprazole (PREVACID) 15 MG capsule  Daily     11/09/17 2047       Waynetta Pean, PA-C 11/09/17 2052    Carmin Muskrat, MD 11/10/17 [phone]

## 2017-11-23 ENCOUNTER — Other Ambulatory Visit: Payer: Self-pay

## 2017-11-23 ENCOUNTER — Emergency Department (HOSPITAL_BASED_OUTPATIENT_CLINIC_OR_DEPARTMENT_OTHER)
Admission: EM | Admit: 2017-11-23 | Discharge: 2017-11-23 | Disposition: A | Discharge status: Home / Self Care | Payer: Managed Care, Other (non HMO) | Attending: Emergency Medicine | Admitting: Emergency Medicine

## 2017-11-23 ENCOUNTER — Encounter (HOSPITAL_BASED_OUTPATIENT_CLINIC_OR_DEPARTMENT_OTHER): Payer: Self-pay

## 2017-11-23 DIAGNOSIS — T7840XA Allergy, unspecified, initial encounter: Secondary | ICD-10-CM

## 2017-11-23 DIAGNOSIS — F17200 Nicotine dependence, unspecified, uncomplicated: Secondary | ICD-10-CM | POA: Insufficient documentation

## 2017-11-23 DIAGNOSIS — Z79899 Other long term (current) drug therapy: Secondary | ICD-10-CM | POA: Diagnosis not present

## 2017-11-23 NOTE — ED Triage Notes (Addendum)
Pt states she was at [facility] into [employer] and had allergic reaction-states felt like her throat was closing-with hx of same-took benadryl 50mg  and called work nurse line-states she feels better however her work made her come to ED due to nurse line advisement-NAD-steady gait-drinking from own cup

## 2017-11-23 NOTE — ED Provider Notes (Signed)
[facility] EMERGENCY DEPARTMENT Provider Note   CSN: [id number] Arrival date & time: 11/23/17  2036     History   Chief Complaint Chief Complaint  Patient presents with  . Allergic Reaction    HPI [facility] is a 22 y.o. female.  Patient with multiple sensitivities to numerous substances. Today while at work she was exposed to a cleaning solution in the bathroom, and developed throat tightness. She did not get short of breath, wheeze, or develop GI symptoms. She took a benadryl, and symptoms resolved. She carries an epi-pen, but did not have to employ it tonight.   The history is provided by the patient. No language interpreter was used.  Allergic Reaction  Severity:  Mild Duration:  20 minutes Context: chemicals   Relieved by:  Antihistamines   Past Medical History:  Diagnosis Date  . Anemia   . 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 Gastritis   . Migraine   . Tachycardia     Patient Active Problem List   Diagnosis Date Noted  . dysmenorrhea 10/31/2017  . History of breast lump/mass excision 07/25/2013  . Fibroadenoma of breast 07/25/2013    Past Surgical History:  Procedure Laterality Date  . ANKLE SURGERY Left   . BREAST BIOPSY Right 2013   excision fibroadenoma    OB History    Gravida Para Term Preterm AB Living   0             SAB TAB Ectopic Multiple Live Births                  Obstetric Comments   1st Menstrual Cycle:  13  1st Pregnancy:  0       Home Medications    Prior to Admission medications   Medication Sig Start Date End Date Taking? Authorizing Provider  acetaminophen (TYLENOL) 500 MG tablet Take 1,000 mg by mouth every 6 (six) hours as needed for mild pain or headache.     [provider]  celecoxib (CELEBREX) 200 MG capsule Take 1 capsule (200 mg total) by mouth 2 (two) times daily as needed (dysmenorrhea). Day 1 take 2 tabs, may take an another 1 tab day 1, PRN Patient not  taking: Reported on 11/09/2017 10/31/17   Will Bonnet, MD  dicyclomine (BENTYL) 20 MG tablet Take 1 tablet (20 mg total) by mouth 2 (two) times daily. 11/09/17   Waynetta Pean, PA-C  EPINEPHrine 0.3 mg/0.3 mL IJ SOAJ injection Inject 0.3 mg into the muscle as needed. 09/07/16   [provider]  etonogestrel (NEXPLANON) 68 MG IMPL implant Inject into the skin.    [provider]  Melatonin 5 MG TABS Take 5 mg by mouth at bedtime.    [provider]  ondansetron (ZOFRAN ODT) 4 MG disintegrating tablet Take 1 tablet (4 mg total) by mouth every 8 (eight) hours as needed for nausea or vomiting. 11/09/17   Waynetta Pean, PA-C  SUMAtriptan (IMITREX) 50 MG tablet Take 50 mg by mouth every 2 (two) hours as needed for migraine. May repeat in 2 hours if headache persists or recurs.    [provider]    Family History Family History  Problem Relation Age of Onset  . Colon cancer Paternal Aunt 46  . Breast cancer Paternal Grandmother 66    Social History Social History   Tobacco Use  . Smoking status: Current Some Day Smoker  . Smokeless tobacco: Never  Used  Substance Use Topics  . Alcohol use: Yes    Comment: occasionally  . Drug use: No     Allergies   Ammonium-containing compounds; Ibuprofen; and Other   Review of Systems Review of Systems  All other systems reviewed and are negative.    Physical Exam Updated Vital Signs BP 125/76 (BP Location: Right Arm)   Pulse 75   Temp 98.3 F (36.8 C) (Oral)   Resp 20   Ht 5\' 9"  (1.753 m)   Wt 80.7 kg (178 lb)   SpO2 100%   BMI 26.29 kg/m   Physical Exam  Constitutional: She appears well-developed and well-nourished. No distress.  HENT:  Head: Normocephalic and atraumatic.  Mouth/Throat: Oropharynx is clear and moist.  Eyes: Conjunctivae are normal.  Neck: Neck supple.  Cardiovascular: Normal rate and regular rhythm.  No murmur heard. Pulmonary/Chest: Effort normal and breath sounds  normal. No respiratory distress. She has no wheezes.  Abdominal: Soft. There is no tenderness.  Musculoskeletal: She exhibits no edema.  Lymphadenopathy:    She has no cervical adenopathy.  Neurological: She is alert.  Skin: Skin is warm and dry.  Psychiatric: She has a normal mood and affect.  Nursing note and vitals reviewed.    ED Treatments / Results  Labs (all labs ordered are listed, but only abnormal results are displayed) Labs Reviewed - No data to display  EKG  EKG Interpretation None       Radiology No results found.  Procedures Procedures (including critical care time)  Medications Ordered in ED Medications - No data to display   Initial Impression / Assessment and Plan / ED Course  I have reviewed the triage vital signs and the nursing notes.  Pertinent labs & imaging results that were available during my care of the patient were reviewed by me and considered in my medical decision making (see chart for details).     Patient re-evaluated prior to dc, is hemodynamically stable, in no respiratory distress, and denies the feeling of throat closing, normal phonation. No wheezing, no vomiting, no syncope. Discussed signs and symptoms of anaphylaxis and severe allergic reaction. Pt advised to return for any worsening in symptoms or any concerns. Pt is to follow up with their PCP. Pt is agreeable with plan & verbalizes understanding.   Final Clinical Impressions(s) / ED Diagnoses   Final diagnoses:  Allergic reaction, initial encounter    ED Discharge Orders    None       Etta Quill, NP 11/23/17 [id number]    Tanna Furry, MD 11/24/17 0001

## 2017-11-25 ENCOUNTER — Other Ambulatory Visit: Payer: Self-pay | Admitting: Obstetrics and Gynecology

## 2017-11-25 DIAGNOSIS — N83201 Unspecified ovarian cyst, right side: Secondary | ICD-10-CM

## 2017-11-26 ENCOUNTER — Encounter: Payer: Self-pay | Admitting: Obstetrics and Gynecology

## 2017-11-26 ENCOUNTER — Ambulatory Visit (INDEPENDENT_AMBULATORY_CARE_PROVIDER_SITE_OTHER): Payer: BC Managed Care – PPO | Admitting: Obstetrics and Gynecology

## 2017-11-26 ENCOUNTER — Ambulatory Visit (INDEPENDENT_AMBULATORY_CARE_PROVIDER_SITE_OTHER): Payer: BC Managed Care – PPO

## 2017-11-26 DIAGNOSIS — R102 Pelvic and perineal pain: Secondary | ICD-10-CM

## 2017-11-26 DIAGNOSIS — N83201 Unspecified ovarian cyst, right side: Secondary | ICD-10-CM | POA: Diagnosis not present

## 2017-11-26 NOTE — Progress Notes (Signed)
Obstetrics & Gynecology Office Visit    Chief Complaint  Patient presents with  . U/S follow up  heavy menstrual bleeding, dysmenorrhea  History of Present Illness: 22 y.o. G0P0 female who presents for follow up ultrasound. Her endometrial stripe today is roughly the same. However, she has had no vaginal bleeding since her last visit.  She continues, however, to have pain.  Her pain remains predominantly on her right side.  She has been evaluated multiple times in the ER. She has been seen by GI at [facility]. She has had a CT at [facility] showing diverticula without evidence of diverticulitis.  She otherwise has had no findings to suggest a cause for her pain.   With her pain, she has had episodes of diarrhea, nausea, and vomiting.  She states that she is being treated for gastritis. She was given a prescription for Celebrex for me at her last visit (due to her history of gastritis). However, she was seen at an urgent care clinic where she was told not to take Celebrex due to her history of gastritis.  She states that she normally does not have constipation. Even more recently she has been having multiple bowel movements each day.  Her pain is no longer associated with her menses, as before.  Her ultrasound today was reviewed today and showed essentially no changes (see report).   Past Medical History:  Diagnosis Date  . Anemia   . 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 Gastritis   . Migraine   . Tachycardia     Past Surgical History:  Procedure Laterality Date  . ANKLE SURGERY Left   . BREAST BIOPSY Right 2013   excision fibroadenoma    Gynecologic History: No LMP recorded. Patient has had an implant.  Obstetric History: G0P0  Family History  Problem Relation Age of Onset  . Colon cancer Paternal Aunt 40  . Breast cancer Paternal Grandmother 14    Social History   Socioeconomic History  . Marital status: Single    Spouse name: Not on file  . Number of  children: Not on file  . Years of education: Not on file  . Highest education level: Not on file  Social Needs  . Financial resource strain: Not on file  . Food insecurity - worry: Not on file  . Food insecurity - inability: Not on file  . Transportation needs - medical: Not on file  . Transportation needs - non-medical: Not on file  Occupational History  . Not on file  Tobacco Use  . Smoking status: Current Some Day Smoker  . Smokeless tobacco: Never Used  Substance and Sexual Activity  . Alcohol use: Yes    Comment: occasionally  . Drug use: No  . Sexual activity: Yes    Birth control/protection: Implant  Other Topics Concern  . Not on file  Social History Narrative  . Not on file    Allergies  Allergen Reactions  . Ammonium-Containing Compounds   . Ibuprofen Nausea Only    gastritis  . Other     Gain brand cleaning    Prior to Admission medications   Medication Sig Start Date End Date Taking? Authorizing Provider  acetaminophen (TYLENOL) 500 MG tablet Take 1,000 mg by mouth every 6 (six) hours as needed for mild pain or headache.     [provider]  dicyclomine (BENTYL) 20 MG tablet Take 1 tablet (20 mg total) by mouth 2 (two) times daily. 11/09/17  Waynetta Pean, PA-C  EPINEPHrine 0.3 mg/0.3 mL IJ SOAJ injection Inject 0.3 mg into the muscle as needed. 09/07/16   [provider]  etonogestrel (NEXPLANON) 68 MG IMPL implant Inject into the skin.    [provider]  Melatonin 5 MG TABS Take 5 mg by mouth at bedtime.    [provider]  ondansetron (ZOFRAN ODT) 4 MG disintegrating tablet Take 1 tablet (4 mg total) by mouth every 8 (eight) hours as needed for nausea or vomiting. 11/09/17   Waynetta Pean, PA-C  SUMAtriptan (IMITREX) 50 MG tablet Take 50 mg by mouth every 2 (two) hours as needed for migraine. May repeat in 2 hours if headache persists or recurs.    [provider]    Review of Systems  Constitutional:  Negative.   HENT: Negative.   Eyes: Negative.   Respiratory: Negative.   Cardiovascular: Negative.   Gastrointestinal: Positive for abdominal pain and diarrhea. Negative for blood in stool, constipation, heartburn, melena, nausea and vomiting.  Genitourinary: Negative.   Musculoskeletal: Negative.   Skin: Negative.   Neurological: Negative.   Psychiatric/Behavioral: Negative.      Physical Exam BP (!) 120/58   Pulse (!) 120   Ht 5\' 9"  (1.753 m)   Wt 180 lb (81.6 kg)   BMI 26.58 kg/m  No LMP recorded. Patient has had an implant. Physical Exam  Constitutional: She is oriented to person, place, and time. She appears well-developed and well-nourished. No distress.  Eyes: EOM are normal. No scleral icterus.  Cardiovascular: Normal rate and regular rhythm.  Pulmonary/Chest: Effort normal and breath sounds normal. No respiratory distress. She has no wheezes. She has no rales.  Musculoskeletal: Normal range of motion. She exhibits no edema.  Neurological: She is alert and oriented to person, place, and time. No cranial nerve deficit.  Skin: Skin is warm and dry. No erythema.  Psychiatric: She has a normal mood and affect. Her behavior is normal. Judgment normal.   Assessment: 22 y.o. G0P0 female here for  1. Pelvic pain      Plan: Problem List Items Addressed This Visit      Other   Pelvic pain     She as yet has pain of unknown origin. I reiterated the difficulty in diagnosing many type of pelvic pain/abdominal pain, especially at such a young age.  I have encouraged her to have a full recommendation from GI, given her young age with diverticular disease and multiple GI symptoms.  We discussed that other sources of pain could related to MSK, urologic, gynecologic, and even in some cases psychiatric.  As a gynecologist, I encouraged her to complete any recommendations and workup through GI.  She has no obvious GU issues and no trigger points on abdominal exam.  As a gynecologist, I  can offer her a diagnostic laparoscopy for assessment of endometriosis, as she is already utilizing medication that could effectively treat the disease.  We discussed that endometriosis does not usually show up on nearly any form of imaging (unless she has an endometrioma).  She clearly has a pain issue as she has sought help and care at multiple facilities across multiple specialties and, so far, has not asked me for pain medication.  She would like to consider surgery with a diagnostic laparoscopy, with the understanding that I could find something that would be helpful.  She also understands that the surgery may yield no findings.  I believe she has sought an answer for her pain to  the point where surgery may be beneficial at this point. She would like to continue to consider and will let me know.  15 minutes spent in face to face discussion with > 50% spent in counseling, management, and coordination of care of her pelvic pain.   Prentice Docker, MD 11/27/2017 4:41 PM

## 2017-11-27 ENCOUNTER — Encounter: Payer: Self-pay | Admitting: Obstetrics and Gynecology

## 2017-11-27 DIAGNOSIS — R102 Pelvic and perineal pain: Secondary | ICD-10-CM | POA: Insufficient documentation

## 2017-12-10 ENCOUNTER — Ambulatory Visit: Payer: BC Managed Care – PPO | Admitting: Obstetrics and Gynecology

## 2017-12-11 ENCOUNTER — Emergency Department (HOSPITAL_COMMUNITY): Payer: Managed Care, Other (non HMO)

## 2017-12-11 ENCOUNTER — Encounter (HOSPITAL_COMMUNITY): Payer: Self-pay | Admitting: Emergency Medicine

## 2017-12-11 ENCOUNTER — Emergency Department (HOSPITAL_COMMUNITY)
Admission: EM | Admit: 2017-12-11 | Discharge: 2017-12-11 | Disposition: A | Discharge status: Home / Self Care | Payer: Managed Care, Other (non HMO) | Attending: Emergency Medicine | Admitting: Emergency Medicine

## 2017-12-11 DIAGNOSIS — R072 Precordial pain: Secondary | ICD-10-CM | POA: Diagnosis not present

## 2017-12-11 DIAGNOSIS — R0602 Shortness of breath: Secondary | ICD-10-CM | POA: Diagnosis present

## 2017-12-11 DIAGNOSIS — F17210 Nicotine dependence, cigarettes, uncomplicated: Secondary | ICD-10-CM | POA: Insufficient documentation

## 2017-12-11 LAB — CBC
HEMATOCRIT: 36.5 % (ref 36.0–46.0)
HEMOGLOBIN: 12.4 g/dL (ref 12.0–15.0)
MCH: 23.8 pg — AB (ref 26.0–34.0)
MCHC: 34 g/dL (ref 30.0–36.0)
MCV: 70.1 fL — AB (ref 78.0–100.0)
Platelets: 256 10*3/uL (ref 150–400)
RBC: 5.21 MIL/uL — ABNORMAL HIGH (ref 3.87–5.11)
RDW: 14.4 % (ref 11.5–15.5)
WBC: 5.2 10*3/uL (ref 4.0–10.5)

## 2017-12-11 LAB — BASIC METABOLIC PANEL
ANION GAP: 12 (ref 5–15)
BUN: 7 mg/dL (ref 6–20)
CALCIUM: 9.2 mg/dL (ref 8.9–10.3)
CO2: 20 mmol/L — AB (ref 22–32)
Chloride: 104 mmol/L (ref 101–111)
Creatinine, Ser: 0.61 mg/dL (ref 0.44–1.00)
GFR calc Af Amer: >60 mL/min (ref >60)
GFR calc non Af Amer: >60 mL/min (ref >60)
GLUCOSE: 99 mg/dL (ref 65–99)
Potassium: 4 mmol/L (ref 3.5–5.1)
Sodium: 136 mmol/L (ref 135–145)

## 2017-12-11 LAB — I-STAT TROPONIN, ED: TROPONIN I, POC: 0 ng/mL (ref 0.00–0.08)

## 2017-12-11 LAB — HEPATIC FUNCTION PANEL
ALBUMIN: 3.9 g/dL (ref 3.5–5.0)
ALT: 38 U/L (ref 14–54)
AST: 29 U/L (ref 15–41)
Alkaline Phosphatase: 62 U/L (ref 38–126)
Bilirubin, Direct: <0.1 mg/dL — ABNORMAL LOW (ref 0.1–0.5)
TOTAL PROTEIN: 8 g/dL (ref 6.5–8.1)
Total Bilirubin: 0.5 mg/dL (ref 0.3–1.2)

## 2017-12-11 LAB — I-STAT BETA HCG BLOOD, ED (MC, WL, AP ONLY): I-stat hCG, quantitative: <5 m[IU]/mL (ref <5)

## 2017-12-11 LAB — D-DIMER, QUANTITATIVE: D-Dimer, Quant: <0.27 ug/mL-FEU (ref 0.00–0.50)

## 2017-12-11 LAB — LIPASE, BLOOD: Lipase: 30 U/L (ref 11–51)

## 2017-12-11 MED ORDER — LORAZEPAM 2 MG/ML IJ SOLN
0.5000 mg | Freq: Once | INTRAMUSCULAR | Status: AC
  Administered 2017-12-11: 0.5 mg via INTRAVENOUS
  Filled 2017-12-11: qty 1

## 2017-12-11 MED ORDER — HYDROXYZINE HCL 25 MG PO TABS: 25.0000 mg | ORAL_TABLET | Freq: Four times a day (QID) | ORAL | 0 refills | Status: DC | PRN

## 2017-12-11 MED ORDER — SODIUM CHLORIDE 0.9 % IV BOLUS (SEPSIS)
500.0000 mL | Freq: Once | INTRAVENOUS | Status: AC
  Administered 2017-12-11: 500 mL via INTRAVENOUS

## 2017-12-11 NOTE — Discharge Instructions (Signed)

## 2017-12-11 NOTE — ED Provider Notes (Signed)
Emergency Department Provider Note   I have reviewed the triage vital signs and the nursing notes.   HISTORY  Chief Complaint Chest Pain   HPI Deborah Weber is a 22 y.o. female who presents to the emergency department for evaluation of sudden onset chest tightness and shortness of breath.  Symptoms are nonradiating.  Patient describes going to the floor because of severe discomfort.  Pain is worse with breathing.  She has had more minor episodes that were similar to this but resolved spontaneously.  Patient states that she has had intermittent chest pains that she was a child and is seen several cardiologists with no clear diagnosis.  She is on Nexplanon birth control.  No leg swelling or prior DVT/PE history.  Patient also describes generalized abdominal discomfort that is been going on for several months.  She has had multiple ED visits for this and is currently trying to establish care with a gastroenterologist.    Past Medical History:  Diagnosis Date  . Anemia   . 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 Gastritis   . Migraine   . Tachycardia     Patient Active Problem List   Diagnosis Date Noted  . Pelvic pain 11/27/2017  . dysmenorrhea 10/31/2017  . History of breast lump/mass excision 07/25/2013  . Fibroadenoma of breast 07/25/2013    Past Surgical History:  Procedure Laterality Date  . ANKLE SURGERY Left   . BREAST BIOPSY Right 2013   excision fibroadenoma    Current Outpatient Rx  . Order #: [id number] Class: Historical Med  . Order #: [id number] Class: Normal  . Order #: [id number] Class: Print  . Order #: [id number] Class: Historical Med  . Order #: [id number] Class: Historical Med  . Order #: [id number] Class: Historical Med  . Order #: [id number] Class: Print  . Order #: [id number] Class: Historical Med  . Order #: [id number] Class: Print    Allergies Ammonium-containing compounds; Ibuprofen; and Other  Family History  Problem Relation  Age of Onset  . Colon cancer Paternal Aunt 25  . Breast cancer Paternal Grandmother 47    Social History Social History   Tobacco Use  . Smoking status: Current Some Day Smoker  . Smokeless tobacco: Never Used  Substance Use Topics  . Alcohol use: Yes    Comment: occasionally  . Drug use: No    Review of Systems  Constitutional: No fever/chills Eyes: No visual changes. ENT: No sore throat. Cardiovascular: Positive chest pain. Respiratory: Positive shortness of breath. Gastrointestinal: No abdominal pain.  No nausea, no vomiting.  No diarrhea.  No constipation. Genitourinary: Negative for dysuria. Musculoskeletal: Negative for back pain. Skin: Negative for rash. Neurological: Negative for headaches, focal weakness or numbness.  10-point ROS otherwise negative.  ____________________________________________   PHYSICAL EXAM:  VITAL SIGNS: ED Triage Vitals  Enc Vitals Group     BP 12/11/17 1219 125/61     Pulse Rate 12/11/17 1219 81     Resp 12/11/17 1219 (!) 22     Temp 12/11/17 1219 98.3 F (36.8 C)     Temp Source 12/11/17 1219 Oral     SpO2 12/11/17 1218 99 %     Pain Score 12/11/17 1220 7    Constitutional: Alert and oriented. Patient appears anxious with rapid breathing and slightly pressured speech.  Eyes: Conjunctivae are normal. Head: Atraumatic. Nose: No congestion/rhinnorhea. Mouth/Throat: Mucous membranes are moist.   Neck: No stridor.  Cardiovascular: Tachycardia. Good peripheral circulation. Grossly normal heart sounds.  Respiratory: Normal respiratory effort.  No retractions. Lungs CTAB. Gastrointestinal: Soft and nontender. No distention.  Musculoskeletal: No lower extremity tenderness nor edema. No gross deformities of extremities. Neurologic:  Normal speech and language. No gross focal neurologic deficits are appreciated.  Skin:  Skin is warm, dry and intact. No rash noted.   ____________________________________________   LABS (all labs  ordered are listed, but only abnormal results are displayed)  Labs Reviewed  BASIC METABOLIC PANEL - Abnormal; Notable for the following components:      Result Value   CO2 20 (*)    All other components within normal limits  CBC - Abnormal; Notable for the following components:   RBC 5.21 (*)    MCV 70.1 (*)    MCH 23.8 (*)    All other components within normal limits  HEPATIC FUNCTION PANEL - Abnormal; Notable for the following components:   Bilirubin, Direct <0.1 (*)    All other components within normal limits  LIPASE, BLOOD  D-DIMER, QUANTITATIVE (NOT AT [facility])  I-STAT TROPONIN, ED  I-STAT BETA HCG BLOOD, ED ([facility], [facility], [facility] ONLY)   ____________________________________________  EKG   EKG Interpretation  Date/Time:  Saturday December 11 2017 12:20:07 EST Ventricular Rate:  83 PR Interval:    QRS Duration: 83 QT Interval:  350 QTC Calculation: 412 R Axis:   72 Text Interpretation:  Sinus rhythm No STEMI.  Confirmed by Nanda Quinton [phone]) on 12/11/2017 12:32:03 PM Also confirmed by Nanda Quinton [phone]), editor Philomena Doheny [phone])  on 12/11/2017 1:18:34 PM       ____________________________________________  RADIOLOGY  Dg Chest 2 View  Result Date: 12/11/2017 CLINICAL DATA:  Centralized chest pain, shortness of Breath EXAM: CHEST  2 VIEW COMPARISON:  09/12/2016 FINDINGS: Heart and mediastinal contours are within normal limits. No focal opacities or effusions. No acute bony abnormality. IMPRESSION: No active cardiopulmonary disease. Electronically Signed   By: Rolm Baptise M.D.   On: 12/11/2017 12:44    ____________________________________________   PROCEDURES  Procedure(s) performed:   Procedures  None ____________________________________________   INITIAL IMPRESSION / ASSESSMENT AND PLAN / ED COURSE  Pertinent labs & imaging results that were available during my care of the patient were reviewed by me and considered in my medical decision making (see chart for  details).  Patient presents to the emergency department with shortness of breath and chest pain.  Symptoms began suddenly and have continued since arrival in the emergency department.  No hypoxemia but does have intermittent mild tachycardia.  Lower suspicion for PE in the situation with plan for d-dimer, troponin, chest x-ray, and reassess patient after some IV fluids and small amount of Ativan.  Differential includes all life-threatening causes for chest pain. This includes but is not exclusive to acute coronary syndrome, aortic dissection, pulmonary embolism, cardiac tamponade, community-acquired pneumonia, pericarditis, musculoskeletal chest wall pain, etc.   Patient labs are unremarkable along with imaging. Feeling better after IVF and Ativan. Normal d-dimer. Plan for PCP and Cardiology follow up ASAP.   At this time, I do not feel there is any life-threatening condition present. I have reviewed and discussed all results (EKG, imaging, lab, urine as appropriate), exam findings with patient. I have reviewed nursing notes and appropriate previous records.  I feel the patient is safe to be discharged home without further emergent workup. Discussed usual and customary return precautions. Patient and family (if present) verbalize understanding and are comfortable with this plan.  Patient will follow-up with their primary care provider. If  they do not have a primary care provider, information for follow-up has been provided to them. All questions have been answered.  ____________________________________________  FINAL CLINICAL IMPRESSION(S) / ED DIAGNOSES  Final diagnoses:  Precordial chest pain  Shortness of breath     MEDICATIONS GIVEN DURING THIS VISIT:  Medications  sodium chloride 0.9 % bolus 500 mL (0 mLs Intravenous Stopped 12/11/17 1606)  LORazepam (ATIVAN) injection 0.5 mg (0.5 mg Intravenous Given 12/11/17 1318)     NEW OUTPATIENT MEDICATIONS STARTED DURING THIS VISIT:  Discharge  Medication List as of 12/11/2017  3:18 PM    START taking these medications   Details  hydrOXYzine (ATARAX/VISTARIL) 25 MG tablet Take 1 tablet (25 mg total) by mouth every 6 (six) hours as needed for anxiety., Starting Sat 12/11/2017, Print        Note:  This document was prepared using Dragon voice recognition software and may include unintentional dictation errors.  Nanda Quinton, MD Emergency Medicine    Long, [facility], MD 12/11/17 [phone]

## 2017-12-11 NOTE — ED Triage Notes (Signed)
Per EMS:  Patient presents to ED after a sudden onset on midsternal CP starting after she began to feel dizzy while sitting on the couch.  Patient began to feel light-headed.  Pain is worse with deep respirations.  Patient has had several episodes similar in the past with halter monitor, hx of reflux.

## 2018-01-10 ENCOUNTER — Other Ambulatory Visit: Payer: Self-pay | Admitting: Internal Medicine

## 2018-01-10 ENCOUNTER — Ambulatory Visit
Admission: RE | Admit: 2018-01-10 | Discharge: 2018-01-10 | Disposition: A | Discharge status: Home / Self Care | Payer: Managed Care, Other (non HMO) | Source: Ambulatory Visit | Attending: Internal Medicine | Admitting: Internal Medicine

## 2018-01-10 ENCOUNTER — Encounter: Payer: Self-pay | Admitting: Surgery

## 2018-01-10 DIAGNOSIS — K222 Esophageal obstruction: Secondary | ICD-10-CM | POA: Diagnosis not present

## 2018-01-10 DIAGNOSIS — K223 Perforation of esophagus: Secondary | ICD-10-CM

## 2018-04-16 ENCOUNTER — Emergency Department (HOSPITAL_COMMUNITY)
Admission: EM | Admit: 2018-04-16 | Discharge: 2018-04-16 | Disposition: A | Discharge status: Home / Self Care | Payer: Managed Care, Other (non HMO) | Attending: Emergency Medicine | Admitting: Emergency Medicine

## 2018-04-16 ENCOUNTER — Encounter (HOSPITAL_COMMUNITY): Payer: Self-pay | Admitting: Emergency Medicine

## 2018-04-16 ENCOUNTER — Other Ambulatory Visit: Payer: Self-pay

## 2018-04-16 DIAGNOSIS — F17200 Nicotine dependence, unspecified, uncomplicated: Secondary | ICD-10-CM | POA: Diagnosis not present

## 2018-04-16 DIAGNOSIS — R1030 Lower abdominal pain, unspecified: Secondary | ICD-10-CM | POA: Insufficient documentation

## 2018-04-16 DIAGNOSIS — Z79899 Other long term (current) drug therapy: Secondary | ICD-10-CM | POA: Diagnosis not present

## 2018-04-16 HISTORY — DX: Irritable bowel syndrome without diarrhea: K58.9

## 2018-04-16 LAB — CBC
HCT: 34.8 % — ABNORMAL LOW (ref 36.0–46.0)
Hemoglobin: 11.6 g/dL — ABNORMAL LOW (ref 12.0–15.0)
MCH: 23.7 pg — AB (ref 26.0–34.0)
MCHC: 33.3 g/dL (ref 30.0–36.0)
MCV: 71 fL — AB (ref 78.0–100.0)
PLATELETS: 283 10*3/uL (ref 150–400)
RBC: 4.9 MIL/uL (ref 3.87–5.11)
RDW: 14.3 % (ref 11.5–15.5)
WBC: 4.4 10*3/uL (ref 4.0–10.5)

## 2018-04-16 LAB — I-STAT BETA HCG BLOOD, ED (MC, WL, AP ONLY)

## 2018-04-16 LAB — COMPREHENSIVE METABOLIC PANEL
ALK PHOS: 51 U/L (ref 38–126)
ALT: 18 U/L (ref 14–54)
AST: 16 U/L (ref 15–41)
Albumin: 3.9 g/dL (ref 3.5–5.0)
Anion gap: 8 (ref 5–15)
BUN: 7 mg/dL (ref 6–20)
CALCIUM: 9 mg/dL (ref 8.9–10.3)
CHLORIDE: 108 mmol/L (ref 101–111)
CO2: 23 mmol/L (ref 22–32)
CREATININE: 0.64 mg/dL (ref 0.44–1.00)
GFR calc non Af Amer: >60 mL/min (ref >60)
GLUCOSE: 91 mg/dL (ref 65–99)
Potassium: 3.9 mmol/L (ref 3.5–5.1)
SODIUM: 139 mmol/L (ref 135–145)
Total Bilirubin: 0.4 mg/dL (ref 0.3–1.2)
Total Protein: 7.5 g/dL (ref 6.5–8.1)

## 2018-04-16 LAB — URINALYSIS, ROUTINE W REFLEX MICROSCOPIC
Bilirubin Urine: NEGATIVE
Glucose, UA: NEGATIVE mg/dL
Ketones, ur: NEGATIVE mg/dL
Nitrite: NEGATIVE
PROTEIN: NEGATIVE mg/dL
SPECIFIC GRAVITY, URINE: 1.017 (ref 1.005–1.030)
pH: 5 (ref 5.0–8.0)

## 2018-04-16 LAB — LIPASE, BLOOD: LIPASE: 30 U/L (ref 11–51)

## 2018-04-16 MED ORDER — FENTANYL CITRATE (PF) 100 MCG/2ML IJ SOLN: 50.0000 ug | Freq: Once | INTRAMUSCULAR | Status: DC

## 2018-04-16 NOTE — ED Notes (Signed)
Patient able to ambulate independently  

## 2018-04-16 NOTE — ED Triage Notes (Signed)
Patient with history of IBS complaining of diarrhea and abdominal pain for "as long as [she] can remember". Patient states she has seen a GI doctor about complaint, patient was prescribed multiple medications without relief. Patient alert, oriented, and in no apparent distress at this time.

## 2018-04-16 NOTE — Discharge Instructions (Signed)
Schedule appointment with the GI doctor.   Return to the Emergency Department immediately if you experience any worsening abdominal pain, fever, persistent nausea and vomiting, inability keep any food down, pain with urination, blood in your urine or any other worsening or concerning symptoms.

## 2018-04-16 NOTE — ED Notes (Signed)
Patient states she needs to leave to pick up her  Nephew.  Is currently unable to get a hold of anyone. PA aware, will come to speak to patient.

## 2018-04-16 NOTE — ED Provider Notes (Signed)
[facility] EMERGENCY DEPARTMENT Provider Note   CSN: [id number] Arrival date & time: 04/16/18  9833     History   Chief Complaint Chief Complaint  Patient presents with  . Abdominal Pain    HPI Deborah Weber is a 22 y.o. female past medical history of anemia, gastritis, irritable bowel syndrome who presents for evaluation of chronic diarrhea and chronic abdominal cramping.  Patient reports that this is been ongoing for the last year.  She states she has multiple episodes of diarrhea a day.  Sometimes up to 10 episodes a day.  She states there is no blood noted in stools.  She states that she has not had any recent antibiotic use, travel outside the country.  Patient also reports she has had some lower abdominal cramping that is worse on the right side.  She states that this is been intermittent for the last month.  She denies any changes in symptoms but states that today, when the cramping began, she felt like it needed to be evaluated since this is been going on with no answers.  She reports she has not taken any pain medication for the pain.  Patient has been worked up by a GI doctor who she sees in [place] for the last year.  No conclusive finding is what is causing her symptoms.  She is on Bentyl to help with her pain.  Patient reports that she was told that she had irritable bowel syndrome which could be contributing to her symptoms.  She has been able to eat and drink without any difficulty.  Patient denies any fevers, chest pain, difficulty breathing, vaginal bleeding, dysuria, hematuria, nausea/vomiting.   The history is provided by the patient.    Past Medical History:  Diagnosis Date  . Anemia   . 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 Gastritis   . Irritable bowel syndrome (IBS)   . Migraine   . Tachycardia     Patient Active Problem List   Diagnosis Date Noted  . Pelvic pain 11/27/2017  . dysmenorrhea  10/31/2017  . History of breast lump/mass excision 07/25/2013  . Fibroadenoma of breast 07/25/2013    Past Surgical History:  Procedure Laterality Date  . ANKLE SURGERY Left   . BREAST BIOPSY Right 2013   excision fibroadenoma     OB History    Gravida  0   Para      Term      Preterm      AB      Living        SAB      TAB      Ectopic      Multiple      Live Births           Obstetric Comments  1st Menstrual Cycle:  13  1st Pregnancy:  1         Home Medications    Prior to Admission medications   Medication Sig Start Date End Date Taking? Authorizing Provider  acetaminophen (TYLENOL) 500 MG tablet Take 1,000 mg by mouth every 6 (six) hours as needed for mild pain or headache.    Yes [provider]  citalopram (CELEXA) 40 MG tablet Take 40 mg by mouth daily. 03/22/18 03/22/19 Yes [provider]  dicyclomine (BENTYL) 20 MG tablet Take 1 tablet (20 mg total) by mouth 2 (two) times daily. 11/09/17  Yes Waynetta Pean, PA-C  EPINEPHrine  0.3 mg/0.3 mL IJ SOAJ injection Inject 0.3 mg into the muscle as needed (for anaphylaxis shock).  09/07/16  Yes [provider]  etonogestrel (NEXPLANON) 68 MG IMPL implant Inject 68 mg into the skin once.    Yes [provider]  hydrOXYzine (ATARAX/VISTARIL) 25 MG tablet Take 1 tablet (25 mg total) by mouth every 6 (six) hours as needed for anxiety. 12/11/17  Yes Long, Wonda Olds, MD  hyoscyamine (LEVSIN, ANASPAZ) 0.125 MG tablet Take 0.125 mg by mouth every 6 (six) hours as needed for diarrhea or loose stools. 03/21/18  Yes [provider]  Melatonin 5 MG TABS Take 5 mg by mouth at bedtime.   Yes [provider]  metoprolol succinate (TOPROL-XL) 25 MG 24 hr tablet Take 25 mg by mouth daily. 03/22/18  Yes [provider]  ondansetron (ZOFRAN ODT) 4 MG disintegrating tablet Take 1 tablet (4 mg total) by mouth every 8 (eight) hours as needed for nausea or vomiting. 11/09/17   Yes Waynetta Pean, PA-C  pantoprazole (PROTONIX) 40 MG tablet Take 40 mg by mouth daily. 03/21/18  Yes [provider]  SUMAtriptan (IMITREX) 50 MG tablet Take 50 mg by mouth every 2 (two) hours as needed for migraine.    Yes [provider]  celecoxib (CELEBREX) 200 MG capsule Take 1 capsule (200 mg total) by mouth 2 (two) times daily as needed (dysmenorrhea). Day 1 take 2 tabs, may take an another 1 tab day 1, PRN Patient not taking: Reported on 04/16/2018 10/31/17   Will Bonnet, MD    Family History Family History  Problem Relation Age of Onset  . Colon cancer Paternal Aunt 47  . Breast cancer Paternal Grandmother 60    Social History Social History   Tobacco Use  . Smoking status: Current Some Day Smoker  . Smokeless tobacco: Never Used  Substance Use Topics  . Alcohol use: Yes    Comment: occasionally  . Drug use: No     Allergies   Ammonium-containing compounds; Ibuprofen; and Other   Review of Systems Review of Systems  Constitutional: Negative for chills and fever.  Respiratory: Negative for cough and shortness of breath.   Cardiovascular: Negative for chest pain.  Gastrointestinal: Positive for abdominal pain and diarrhea. Negative for blood in stool, nausea and vomiting.  Genitourinary: Negative for dysuria, hematuria and vaginal bleeding.  Skin: Negative for rash.  Neurological: Negative for weakness and headaches.     Physical Exam Updated Vital Signs BP 105/65   Pulse 75   Temp 98.7 F (37.1 C) (Oral)   Resp 16   Ht 5\' 8"  (1.727 m)   Wt 78.9 kg (174 lb)   SpO2 100%   BMI 26.46 kg/m   Physical Exam  Constitutional: She is oriented to person, place, and time. She appears well-developed and well-nourished.  HENT:  Head: Normocephalic and atraumatic.  Mouth/Throat: Oropharynx is clear and moist and mucous membranes are normal.  Eyes: Pupils are equal, round, and reactive to light. Conjunctivae, EOM and lids are normal.    Neck: Full passive range of motion without pain.  Cardiovascular: Normal rate, regular rhythm, normal heart sounds and normal pulses. Exam reveals no gallop and no friction rub.  No murmur heard. Pulmonary/Chest: Effort normal and breath sounds normal.  Abdominal: Soft. Normal appearance. There is no tenderness. There is no rigidity, no guarding and no CVA tenderness.  Abdomen is soft, nondistended.  Diffuse tenderness noted in the right side, more towards the mid  and lower abdomen.  No focal point tenderness.  No McBurney's point tenderness.  No CVA tenderness bilaterally.  Musculoskeletal: Normal range of motion.  Neurological: She is alert and oriented to person, place, and time.  Skin: Skin is warm and dry. Capillary refill takes less than 2 seconds.  Psychiatric: She has a normal mood and affect. Her speech is normal.  Nursing note and vitals reviewed.    ED Treatments / Results  Labs (all labs ordered are listed, but only abnormal results are displayed) Labs Reviewed  CBC - Abnormal; Notable for the following components:      Result Value   Hemoglobin 11.6 (*)    HCT 34.8 (*)    MCV 71.0 (*)    MCH 23.7 (*)    All other components within normal limits  URINALYSIS, ROUTINE W REFLEX MICROSCOPIC - Abnormal; Notable for the following components:   APPearance HAZY (*)    Hgb urine dipstick SMALL (*)    Leukocytes, UA TRACE (*)    Bacteria, UA RARE (*)    All other components within normal limits  GASTROINTESTINAL PANEL BY PCR, STOOL (REPLACES STOOL CULTURE)  LIPASE, BLOOD  COMPREHENSIVE METABOLIC PANEL  I-STAT BETA HCG BLOOD, ED ([facility], [facility], [facility] ONLY)    EKG None  Radiology No results found.  Procedures Procedures (including critical care time)  Medications Ordered in ED Medications  fentaNYL (SUBLIMAZE) injection 50 mcg (50 mcg Intravenous Not Given 04/16/18 1223)     Initial Impression / Assessment and Plan / ED Course  I have reviewed the triage vital signs and  the nursing notes.  Pertinent labs & imaging results that were available during my care of the patient were reviewed by me and considered in my medical decision making (see chart for details).     22 year old female with past medical history of IBS, gastritis who presents for evaluation of chronic diarrhea that is been ongoing for the last year and abdominal cramping that is been ongoing for the last few months.  No changes but felt like it needed to be evaluated.  Seen by GI in [facility] with no conclusive finding.  No fevers, nausea/vomiting, urinary complaints. Patient is afebrile, non-toxic appearing, sitting comfortably on examination table. Vital signs reviewed and stable.  On exam, patient has diffuse tenderness on the right side.  No McBurney's point tenderness.  Given duration and presentation of symptoms, do not suspect appendicitis.  History/physical exam is not concerning for ovarian torsion.  Suspect this may be patient's chronic IBS flare.  Low suspicion for C. difficile given that patient has tested negative in the past and she has not had any recent antibiotic use. Initial labs ordered at triage.  Analgesics provided in the department.  I-STAT beta negative.  CMP completely normal without any acute abnormalities.  Lipase unremarkable.  CBC with no significant leukocytosis.  Slight anemia with hemoglobin 11.6.  UA shows trace leukocytes.  Negative nitrites, no pyuria.  RN informed me that patient had to leave.  I discussed with patient.  She reports she has to go pick up a family member and cannot stay for any further evaluation.  Patient does not wish to have any further work-up.  Vital signs are stable.  She is requesting a new GI referral she does not want to see her GI doctor anymore. Patient had ample opportunity for questions and discussion. All patient's questions were answered with full understanding. Strict return precautions discussed. Patient expresses  understanding and agreement to  plan.   Final Clinical Impressions(s) / ED Diagnoses   Final diagnoses:  Lower abdominal pain    ED Discharge Orders    None       Volanda Napoleon, PA-C 04/16/18 1449    Lajean Saver, MD 04/16/18 1511

## 2018-04-16 NOTE — ED Notes (Signed)
Patient refused IV. 

## 2018-11-21 ENCOUNTER — Encounter: Payer: BC Managed Care – PPO | Admitting: Obstetrics and Gynecology

## 2018-12-05 ENCOUNTER — Encounter: Payer: Self-pay | Admitting: Obstetrics and Gynecology

## 2018-12-05 ENCOUNTER — Ambulatory Visit (INDEPENDENT_AMBULATORY_CARE_PROVIDER_SITE_OTHER): Payer: BLUE CROSS/BLUE SHIELD | Admitting: Obstetrics and Gynecology

## 2018-12-05 VITALS — BP 124/74 | Ht 62.0 in | Wt 180.0 lb

## 2018-12-05 DIAGNOSIS — Z3049 Encounter for surveillance of other contraceptives: Secondary | ICD-10-CM

## 2018-12-05 DIAGNOSIS — Z30011 Encounter for initial prescription of contraceptive pills: Secondary | ICD-10-CM

## 2018-12-05 DIAGNOSIS — Z3046 Encounter for surveillance of implantable subdermal contraceptive: Secondary | ICD-10-CM

## 2018-12-05 MED ORDER — NORETHINDRONE 0.35 MG PO TABS: 1.0000 | ORAL_TABLET | Freq: Every day | ORAL | 4 refills | Status: DC

## 2018-12-06 ENCOUNTER — Encounter: Payer: Self-pay | Admitting: Obstetrics and Gynecology

## 2018-12-06 NOTE — Progress Notes (Signed)
  GYNECOLOGY PROCEDURE NOTE  Nexplanon removal discussed in detail.  Risks of infection, bleeding, nerve injury all reviewed.  Patient understands risks and desires to proceed.  Verbal consent obtained.  Patient is certain she wants the Nexplanon removed.  All questions answered.  Procedure: Patient placed in dorsal supine with left arm above head, elbow flexed at 90 degrees, arm resting on examination table.  Nexplanon identified without problems.  Betadine scrub x3.  1 ml of 1% lidocaine injected under Nexplanondevice without problems.  Sterile gloves applied.  Small 0.5cm incision made at distal tip of Nexplanon device with 11 blade scalpel.  Due to scar tissue noted at placement site, there was difficulty in having the device come to the surface of the skin opening.  Curved mosquito clamps used to attempt to retrieve the device just below the level of the skin where the device was palpated.  However, after about 20 minutes of attempting, the device could not be grasped. After discussing this with the patient, I recommended that we clean, anesthetize, and incise the skin at the proximal end of the device which palpated easily just below the dermis of the skin.  She readily agreed.  Therefore, the skin was cleaned with a Betadine scrub x 3, 1 ml Of lidocaine plain was injected, and at the proximal tip of the device, using an 11 blade scalped, a 5 mm incision was made and the Nexplanon was easily retrieved through this incision with simple pressure placed on the distal end of the device.  The device was able to be removed manally.  Nexplanon removed intact without problems.  Pressure applied to both incisions.  Hemostasis obtained.  Steri-strips applied to both incision sites after cleaning the Betadine from the skin. This was followed by bandage and compression dressing placed at each incision site (one 4x4 at each incision and one wrap to cover them both.  Patient tolerated procedure well.  No  complications. At the end of the procedure she had no numbness or tingling in her hands and fingers. She was able to move her fingers and hand without issues.    Assessment: 23 y.o. year old female now s/p Nexplanon removal.  Plan: 1.  Patient given post procedure precautions and asked to call for fever, chills, redness or drainage from her incision, bleeding from incision.  She understands she will likely have a small bruise near site of removal and can remove bandage tomorrow and steri-strips in approximately 1 week.  2) Contraception: She wishes to move forward with a progesterone-only form of contraception. There is a question of a superficial "blood clot" in her arm when she was in [place], [place]).  I have asked her to sign a Release of Information form to have these particular records faxed to my office (I could not find this practice through [place]).  Once I have had a chance to review these records, we can discuss the possible use of estrogen. At this point, if she has had a superficial thrombosis, I would not recommend estrogen. If this is not what occurred to her in [place], and it appears safe to proceed with an estrogen-based form of contraception, we will consider that.    3) norethindrone contraceptive pill eRx sent in for her to use going forward.   Prentice Docker, MD, [facility], [place] 12/06/2018 11:37 AM

## 2019-01-30 ENCOUNTER — Telehealth: Payer: Self-pay

## 2019-01-30 NOTE — Telephone Encounter (Signed)
Pt called triage line, states she see's DR. Minette Brine and she has been waiting for 2 months, he was supposed to receive her records from previous dr, so he can prescribe the correct [facility], please advise

## 2019-01-30 NOTE — Telephone Encounter (Signed)
I still don't see anything in her chart or on my desk. Would you mind asking Lavella Lemons if she's received anything about this patient? Thanks!

## 2019-01-30 NOTE — Telephone Encounter (Signed)
Please advise 

## 2019-02-01 NOTE — Telephone Encounter (Signed)
I have not received anything as of today. I will call her previous office and see what the hold up is.   Thanks

## 2019-02-02 NOTE — Telephone Encounter (Signed)
I resent the fax for the records again. Waiting for the previous office to send them to [place].

## 2019-02-02 NOTE — Telephone Encounter (Signed)
Were you able to find out anything?

## 2019-02-03 NOTE — Telephone Encounter (Signed)
Yes sir she is

## 2019-02-03 NOTE — Telephone Encounter (Signed)
Is the patient aware?

## 2019-02-07 NOTE — Telephone Encounter (Signed)
Tonya, Can you keep this on your radar? This patient has been waiting patiently for these records to be sent.  A phone call from you to the provider's office might also be helpful.  Please do all you can to secure these records as it has a great bearing on where we go from here with her care.  Thank you!

## 2019-02-20 ENCOUNTER — Telehealth: Payer: Self-pay

## 2019-02-20 NOTE — Telephone Encounter (Signed)
Pt calling c vaginal issues.  [phone]  Pt states has several lumps but skin looks normal; pain c sex; pain c urination; no unusual d/c.  Tx'd to [facility] for scheduling.

## 2019-02-24 ENCOUNTER — Ambulatory Visit: Payer: BLUE CROSS/BLUE SHIELD | Admitting: Obstetrics and Gynecology

## 2019-03-28 ENCOUNTER — Other Ambulatory Visit (HOSPITAL_COMMUNITY)
Admission: RE | Admit: 2019-03-28 | Discharge: 2019-03-28 | Disposition: A | Discharge status: Home / Self Care | Payer: 59 | Source: Ambulatory Visit | Attending: Obstetrics and Gynecology | Admitting: Obstetrics and Gynecology

## 2019-03-28 ENCOUNTER — Other Ambulatory Visit: Payer: Self-pay

## 2019-03-28 ENCOUNTER — Ambulatory Visit (INDEPENDENT_AMBULATORY_CARE_PROVIDER_SITE_OTHER): Payer: 59 | Admitting: Obstetrics and Gynecology

## 2019-03-28 ENCOUNTER — Encounter: Payer: Self-pay | Admitting: Obstetrics and Gynecology

## 2019-03-28 VITALS — BP 124/74 | Ht 68.0 in | Wt 185.0 lb

## 2019-03-28 DIAGNOSIS — N941 Unspecified dyspareunia: Secondary | ICD-10-CM | POA: Insufficient documentation

## 2019-03-28 DIAGNOSIS — R102 Pelvic and perineal pain unspecified side: Secondary | ICD-10-CM

## 2019-03-28 DIAGNOSIS — Z3202 Encounter for pregnancy test, result negative: Secondary | ICD-10-CM | POA: Diagnosis not present

## 2019-03-28 DIAGNOSIS — N909 Noninflammatory disorder of vulva and perineum, unspecified: Secondary | ICD-10-CM | POA: Diagnosis not present

## 2019-03-28 LAB — POCT URINALYSIS DIPSTICK
Bilirubin, UA: NEGATIVE
Blood, UA: NEGATIVE
Glucose, UA: NEGATIVE
Ketones, UA: NEGATIVE
Leukocytes, UA: NEGATIVE
Nitrite, UA: NEGATIVE
Protein, UA: NEGATIVE
Spec Grav, UA: 1.02 (ref 1.010–1.025)
Urobilinogen, UA: NEGATIVE E.U./dL — AB
pH, UA: 7.5 (ref 5.0–8.0)

## 2019-03-28 LAB — POCT URINE PREGNANCY: Preg Test, Ur: NEGATIVE

## 2019-03-28 NOTE — Progress Notes (Signed)
Obstetrics & Gynecology Office Visit   Chief Complaint  Patient presents with  . Vaginal Pain    History of Present Illness: 23 y.o. G0P0 female who presents with vaginal bumps and pain with sex.  She also has vaginal itching. She noticed the bumps in her vagina 1 month ago.  They seem to come and go.  She notices them in the perineal area and they are tender to touch.  Eventually they seem to go away.  She notes no alleviating or aggravating factors.  She denies any associated symptoms.  She has no history of the same.  Usually they are in the area near her anus, but not always.  Dyspareunia has been going on for about 1 month.  Level of penetration does not matter.  She is taking the minipill for birth control for the past 3 months.  She notes discharge for the past 2 weeks that is white and without odor.  It is out of the ordinary for her.  She notes itching on the inside of her vagina.  She believes that she is low risk for STDs.  She has not taken anything for this.  Past Medical History:  Diagnosis Date  . Anemia   . 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 Gastritis   . Irritable bowel syndrome (IBS)   . Migraine   . Tachycardia     Past Surgical History:  Procedure Laterality Date  . ANKLE SURGERY Left   . BREAST BIOPSY Right 2013   excision fibroadenoma    Gynecologic History: Patient's last menstrual period was 02/27/2019.  Obstetric History: G0P0  Family History  Problem Relation Age of Onset  . Colon cancer Paternal Aunt 34  . Breast cancer Paternal Grandmother 22    Social History   Socioeconomic History  . Marital status: Single    Spouse name: Not on file  . Number of children: Not on file  . Years of education: Not on file  . Highest education level: Not on file  Occupational History  . Not on file  Social Needs  . Financial resource strain: Not on file  . Food insecurity:    Worry: Not on file    Inability: Not on file  .  Transportation needs:    Medical: Not on file    Non-medical: Not on file  Tobacco Use  . Smoking status: Current Some Day Smoker  . Smokeless tobacco: Never Used  Substance and Sexual Activity  . Alcohol use: Yes    Comment: occasionally  . Drug use: No  . Sexual activity: Yes    Birth control/protection: Implant  Lifestyle  . Physical activity:    Days per week: Not on file    Minutes per session: Not on file  . Stress: Not on file  Relationships  . Social connections:    Talks on phone: Not on file    Gets together: Not on file    Attends religious service: Not on file    Active member of club or organization: Not on file    Attends meetings of clubs or organizations: Not on file    Relationship status: Not on file  . Intimate partner violence:    Fear of current or ex partner: Not on file    Emotionally abused: Not on file    Physically abused: Not on file    Forced sexual activity: Not on file  Other Topics Concern  . Not on file  Social History Narrative  . Not on file    Allergies  Allergen Reactions  . Ammonium-Containing Compounds   . Ibuprofen Nausea Only    gastritis  . Other     Gain brand cleaning    Prior to Admission medications   Medication Sig Start Date End Date Taking? Authorizing Provider  acetaminophen (TYLENOL) 500 MG tablet Take 1,000 mg by mouth every 6 (six) hours as needed for mild pain or headache.     [provider]  citalopram (CELEXA) 40 MG tablet Take 40 mg by mouth daily. 03/22/18 03/22/19  [provider]  dicyclomine (BENTYL) 20 MG tablet Take 1 tablet (20 mg total) by mouth 2 (two) times daily. 11/09/17   Waynetta Pean, PA-C  EPINEPHrine 0.3 mg/0.3 mL IJ SOAJ injection Inject 0.3 mg into the muscle as needed (for anaphylaxis shock).  09/07/16   [provider]  hyoscyamine (LEVSIN, ANASPAZ) 0.125 MG tablet Take 0.125 mg by mouth every 6 (six) hours as needed for diarrhea or loose stools. 03/21/18   [provider]  Melatonin 5 MG TABS Take 5 mg by mouth at bedtime.    [provider]  metoprolol succinate (TOPROL-XL) 25 MG 24 hr tablet Take 25 mg by mouth daily. 03/22/18   [provider]  norethindrone (MICRONOR,CAMILA,ERRIN) 0.35 MG tablet Take 1 tablet (0.35 mg total) by mouth daily. 12/05/18   Will Bonnet, MD  pantoprazole (PROTONIX) 40 MG tablet Take 40 mg by mouth daily. 03/21/18   [provider]  SUMAtriptan (IMITREX) 50 MG tablet Take 50 mg by mouth every 2 (two) hours as needed for migraine.     [provider]    Review of Systems  Constitutional: Positive for malaise/fatigue. Negative for chills, diaphoresis, fever and weight loss.  HENT: Negative.   Eyes: Negative.   Respiratory: Negative.   Cardiovascular: Negative.   Gastrointestinal: Positive for nausea (not currently) and vomiting (not currently). Negative for blood in stool, constipation, diarrhea, heartburn and melena.  Genitourinary: Positive for frequency. Negative for dysuria, flank pain, hematuria and urgency.       See HPI  Musculoskeletal: Negative.   Skin: Negative.   Neurological: Negative.   Psychiatric/Behavioral: Negative.      Physical Exam BP 124/74   Ht 5\' 8"  (1.727 m)   Wt 185 lb (83.9 kg)   LMP 02/27/2019   BMI 28.13 kg/m  Patient's last menstrual period was 02/27/2019. Physical Exam Constitutional:      General: She is not in acute distress.    Appearance: Normal appearance. She is well-developed.  Genitourinary:     Pelvic exam was performed with patient in the lithotomy position.     Vulva, inguinal canal, urethra, bladder, vagina, uterus, right adnexa and left adnexa normal.     No posterior fourchette tenderness, injury or lesion present.        No cervical friability, lesion, bleeding or polyp.  HENT:     Head: Normocephalic and atraumatic.  Eyes:     General: No scleral icterus.    Conjunctiva/sclera: Conjunctivae normal.  Neck:      Musculoskeletal: Normal range of motion and neck supple.  Cardiovascular:     Rate and Rhythm: Normal rate and regular rhythm.     Heart sounds: No murmur. No friction rub. No gallop.   Pulmonary:     Effort: Pulmonary effort is normal. No respiratory distress.     Breath sounds: Normal breath sounds. No wheezing or rales.  Abdominal:     General: Bowel sounds are normal. There is no distension.     Palpations: Abdomen is soft. There is no mass.     Tenderness: There is no abdominal tenderness. There is no guarding or rebound.  Musculoskeletal: Normal range of motion.  Neurological:     General: No focal deficit present.     Mental Status: She is alert and oriented to person, place, and time.     Cranial Nerves: No cranial nerve deficit.  Skin:    General: Skin is warm and dry.     Findings: No erythema.  Psychiatric:        Mood and Affect: Mood normal.        Behavior: Behavior normal.        Judgment: Judgment normal.    Female chaperone present for pelvic and breast  portions of the physical exam  Wet Prep: PH: 4.5 Clue Cells: Negative Fungal elements: Negative Trichomonas: Negative  Urine Pregnancy Test: negative  Assessment: 23 y.o. G0P0 female here for  1. Vaginal pain   2. Pelvic pain   3. Dyspareunia in female   4. Lesion of female perineum      Plan: Problem List Items Addressed This Visit      Other   Pelvic pain   Relevant Orders   Urine Culture   Cytology - PAP   Pregnancy, urine    Other Visit Diagnoses    Vaginal pain    -  Primary   Relevant Orders   POCT Urinalysis Dipstick (Completed)   Cervicovaginal ancillary only   Cytology - PAP   Pregnancy, urine   POCT urine pregnancy (Completed)   Dyspareunia in female       Relevant Orders   Cervicovaginal ancillary only   Cytology - PAP   Pregnancy, urine   Lesion of female perineum         Lesion of perineum: Because of the nature of how these have acted, it is difficult to say the  etiology.  They could be consistent with lymphadenopathy.  Will check for STDs.  She has no gastrointestinal complaints.  Vaginal pain, pelvic pain, dyspareunia: Negative wet prep today.  Will obtain STD screening for gonorrhea, chlamydia, and trichomonas.  We will also update her Pap smear.  We will perform a urine culture as well.  If all are negative, will treat with Flagyl for presumptive vaginitis of uncertain origin.  She voiced understanding and agreement with this plan.  20 minutes spent in face to face discussion with > 50% spent in counseling,management, and coordination of care of her lesion of female perineum, dyspareunia in female, vaginal pain, pelvic pain.   Prentice Docker, MD 03/28/2019 4:07 PM

## 2019-03-29 ENCOUNTER — Encounter: Payer: Self-pay | Admitting: Obstetrics and Gynecology

## 2019-03-30 LAB — CERVICOVAGINAL ANCILLARY ONLY
Chlamydia: NEGATIVE
Neisseria Gonorrhea: NEGATIVE
Trichomonas: NEGATIVE

## 2019-03-30 LAB — URINE CULTURE: Organism ID, Bacteria: NO GROWTH

## 2019-03-31 LAB — CYTOLOGY - PAP: Diagnosis: NEGATIVE

## 2019-04-10 NOTE — Telephone Encounter (Signed)
Pt calling; she was supposed to receive medication after test results.  Results are in but she hasn't received medication at all.  [phone]

## 2019-04-11 ENCOUNTER — Other Ambulatory Visit: Payer: Self-pay | Admitting: Obstetrics and Gynecology

## 2019-04-11 DIAGNOSIS — N760 Acute vaginitis: Secondary | ICD-10-CM

## 2019-04-11 MED ORDER — METRONIDAZOLE 500 MG PO TABS: 500.0000 mg | ORAL_TABLET | Freq: Two times a day (BID) | ORAL | 0 refills | Status: DC

## 2019-04-11 MED ORDER — METRONIDAZOLE 500 MG PO TABS: 500.0000 mg | ORAL_TABLET | Freq: Two times a day (BID) | ORAL | 0 refills | Status: AC

## 2019-04-28 NOTE — Telephone Encounter (Signed)
Pt calling; seen two wks ago; still experiencing some really bad discomfort - almost unbearable.  [phone]

## 2019-05-01 NOTE — Telephone Encounter (Signed)
I messaged her back three days ago after she called.

## 2019-05-16 ENCOUNTER — Ambulatory Visit (INDEPENDENT_AMBULATORY_CARE_PROVIDER_SITE_OTHER): Payer: 59 | Admitting: Obstetrics and Gynecology

## 2019-05-16 ENCOUNTER — Other Ambulatory Visit: Payer: Self-pay

## 2019-05-16 ENCOUNTER — Encounter: Payer: Self-pay | Admitting: Obstetrics and Gynecology

## 2019-05-16 VITALS — BP 118/78 | Ht 68.0 in | Wt 177.0 lb

## 2019-05-16 DIAGNOSIS — A6004 Herpesviral vulvovaginitis: Secondary | ICD-10-CM | POA: Diagnosis not present

## 2019-05-16 MED ORDER — VALACYCLOVIR HCL 500 MG PO TABS: 500.0000 mg | ORAL_TABLET | Freq: Every day | ORAL | 2 refills | Status: AC

## 2019-05-16 NOTE — Progress Notes (Signed)
Obstetrics & Gynecology Office Visit   Chief Complaint:  Chief Complaint  Patient presents with  . Follow-up  . Exposure to STD    pt tested + for HSV at [facility]    History of Present Illness: 23 y.o. G0P0 female who presents in follow-up from a diagnosis of HSV.  She was seen in clinic by me about a month ago for bumps on her groin area.  About a week later she developed open sores and went to [facility].  There she was diagnosed with HSV.  She has been taking the initial outbreak treatment and has nearly resolved all symptoms.  She was also diagnosed with a urinary tract infection and was given amoxicillin.  This caused a yeast infection for which she has taken medication, as well.  She was told to follow-up with me for ongoing management.  She has no acute complaints today.    Past Medical History:  Diagnosis Date  . Anemia   . 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 Gastritis   . Irritable bowel syndrome (IBS)   . Migraine   . Tachycardia     Past Surgical History:  Procedure Laterality Date  . ANKLE SURGERY Left   . BREAST BIOPSY Right 2013   excision fibroadenoma    Gynecologic History: Patient's last menstrual period was 04/11/2019.  Obstetric History: G0P0  Family History  Problem Relation Age of Onset  . Colon cancer Paternal Aunt 26  . Breast cancer Paternal Grandmother 13    Social History   Socioeconomic History  . Marital status: Single    Spouse name: Not on file  . Number of children: Not on file  . Years of education: Not on file  . Highest education level: Not on file  Occupational History  . Not on file  Social Needs  . Financial resource strain: Not on file  . Food insecurity    Worry: Not on file    Inability: Not on file  . Transportation needs    Medical: Not on file    Non-medical: Not on file  Tobacco Use  . Smoking status: Current Some Day Smoker  . Smokeless tobacco: Never Used  Substance and  Sexual Activity  . Alcohol use: Yes    Comment: occasionally  . Drug use: No  . Sexual activity: Yes    Birth control/protection: Implant  Lifestyle  . Physical activity    Days per week: Not on file    Minutes per session: Not on file  . Stress: Not on file  Relationships  . Social Herbalist on phone: Not on file    Gets together: Not on file    Attends religious service: Not on file    Active member of club or organization: Not on file    Attends meetings of clubs or organizations: Not on file    Relationship status: Not on file  . Intimate partner violence    Fear of current or ex partner: Not on file    Emotionally abused: Not on file    Physically abused: Not on file    Forced sexual activity: Not on file  Other Topics Concern  . Not on file  Social History Narrative  . Not on file    Allergies  Allergen Reactions  . Ammonium-Containing Compounds   . Ibuprofen Nausea Only    gastritis  . Other     Gain brand cleaning  Prior to Admission medications   Medication Sig Start Date End Date Taking? Authorizing Provider  norethindrone (MICRONOR,CAMILA,ERRIN) 0.35 MG tablet Take 1 tablet (0.35 mg total) by mouth daily. 12/05/18  Yes Will Bonnet, MD  valACYclovir (VALTREX) 1000 MG tablet  05/07/19  Yes [provider]  acetaminophen (TYLENOL) 500 MG tablet Take 1,000 mg by mouth every 6 (six) hours as needed for mild pain or headache.     [provider]  citalopram (CELEXA) 40 MG tablet Take 40 mg by mouth daily. 03/22/18 03/22/19  [provider]  dicyclomine (BENTYL) 20 MG tablet Take 1 tablet (20 mg total) by mouth 2 (two) times daily. Patient not taking: Reported on 05/16/2019 11/09/17   Waynetta Pean, PA-C  EPINEPHrine 0.3 mg/0.3 mL IJ SOAJ injection Inject 0.3 mg into the muscle as needed (for anaphylaxis shock).  09/07/16   [provider]  hyoscyamine (LEVSIN, ANASPAZ) 0.125 MG tablet Take 0.125 mg by mouth every 6  (six) hours as needed for diarrhea or loose stools. 03/21/18   [provider]  Melatonin 5 MG TABS Take 5 mg by mouth at bedtime.    [provider]  metoprolol succinate (TOPROL-XL) 25 MG 24 hr tablet Take 25 mg by mouth daily. 03/22/18   [provider]  pantoprazole (PROTONIX) 40 MG tablet Take 40 mg by mouth daily. 03/21/18   [provider]  SUMAtriptan (IMITREX) 50 MG tablet Take 50 mg by mouth every 2 (two) hours as needed for migraine.     [provider]    Review of Systems  Constitutional: Negative.   HENT: Negative.   Eyes: Negative.   Respiratory: Negative.   Cardiovascular: Negative.   Gastrointestinal: Negative.   Genitourinary: Negative.   Musculoskeletal: Negative.   Skin: Negative.   Neurological: Negative.   Psychiatric/Behavioral: Negative.      Physical Exam BP 118/78   Ht 5\' 8"  (1.727 m)   Wt 177 lb (80.3 kg)   LMP 04/11/2019   BMI 26.91 kg/m  Patient's last menstrual period was 04/11/2019. Physical Exam Constitutional:      General: She is not in acute distress.    Appearance: Normal appearance. She is well-developed.  Genitourinary:     Pelvic exam was performed with patient in the lithotomy position.     Vulva, inguinal canal, urethra, bladder, vagina, uterus, right adnexa and left adnexa normal.     No posterior fourchette tenderness, injury or lesion present.     No cervical friability, lesion, bleeding or polyp.  HENT:     Head: Normocephalic and atraumatic.  Eyes:     General: No scleral icterus.    Conjunctiva/sclera: Conjunctivae normal.  Neck:     Musculoskeletal: Normal range of motion and neck supple.  Cardiovascular:     Rate and Rhythm: Normal rate and regular rhythm.     Heart sounds: No murmur. No friction rub. No gallop.   Pulmonary:     Effort: Pulmonary effort is normal. No respiratory distress.     Breath sounds: Normal breath sounds. No wheezing or rales.  Abdominal:     General:  Bowel sounds are normal. There is no distension.     Palpations: Abdomen is soft. There is no mass.     Tenderness: There is no abdominal tenderness. There is no guarding or rebound.  Musculoskeletal: Normal range of motion.  Neurological:     General: No focal deficit present.     Mental Status: She is alert and  oriented to person, place, and time.     Cranial Nerves: No cranial nerve deficit.  Skin:    General: Skin is warm and dry.     Findings: No erythema.  Psychiatric:        Mood and Affect: Mood normal.        Behavior: Behavior normal.        Judgment: Judgment normal.     Female chaperone present for pelvic and breast  portions of the physical exam  Assessment: 23 y.o. G0P0 female here for  1. Herpes simplex vulvovaginitis      Plan: Problem List Items Addressed This Visit    None    Visit Diagnoses    Herpes simplex vulvovaginitis    -  Primary   Relevant Medications   valACYclovir (VALTREX) 1000 MG tablet   valACYclovir (VALTREX) 500 MG tablet     She had a full STD screening at [place].  All of which was negative.  We discussed ongoing suppressive therapy versus treatment as needed.  My recommendation to her is that she only take treatment when she has symptoms.  I did provide an ample supply of treatment prescriptions.  If she has frequent outbreaks, then will consider suppression.  She voiced understanding and agreement with this proposition.  15 minutes spent in face to face discussion with > 50% spent in counseling,management, and coordination of care of her herpes simplex vulvovaginitis.  Prentice Docker, MD 05/16/2019 5:18 PM

## 2019-06-01 ENCOUNTER — Other Ambulatory Visit: Payer: Self-pay | Admitting: Advanced Practice Midwife

## 2019-06-01 DIAGNOSIS — B009 Herpesviral infection, unspecified: Secondary | ICD-10-CM

## 2019-06-01 MED ORDER — VALACYCLOVIR HCL 500 MG PO TABS: 500.0000 mg | ORAL_TABLET | Freq: Every day | ORAL | 11 refills | Status: DC

## 2019-06-01 NOTE — Progress Notes (Unsigned)
Daily suppression Rx Valtrex sent to patient's pharmacy.

## 2019-06-15 IMAGING — CT CT ABD-PELV W/ CM
2 of 4 series · 16 of 46 positions shown, 18 images · IV contrast (Omni 300)
Comparison: None.

CLINICAL DATA: Abdominal pain.  Diverticulitis suspected.

EXAM:
CT ABDOMEN AND PELVIS WITH CONTRAST
TECHNIQUE: Multidetector CT imaging of the abdomen and pelvis was performed
using the standard protocol following bolus administration of
intravenous contrast.
CONTRAST:  100mL A1PQ3G-LKK IOPAMIDOL (A1PQ3G-LKK) INJECTION 61%

[Series 3: a/p w/ 5mm · axial · 0.88mm/px · z∈[+615,+1065]mm · 13 of 98 slices shown, 15 images]
[im 4/98  soft-tissue]
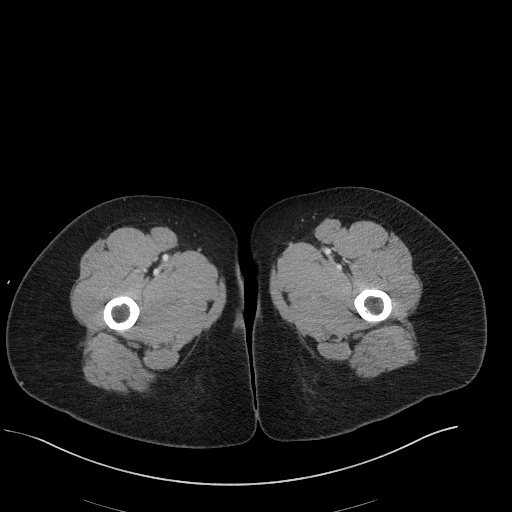
[im 4/98  bone]
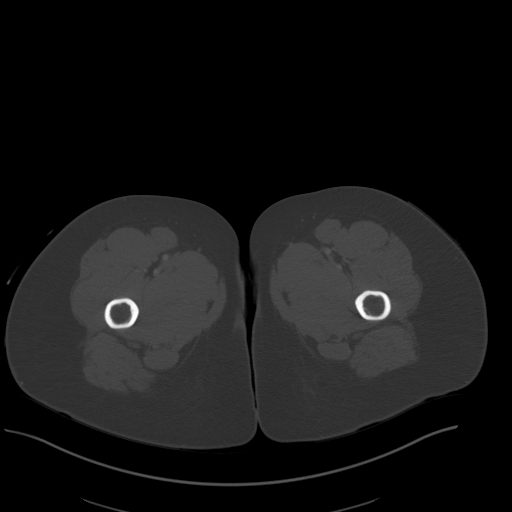
[im 12/98  soft-tissue]
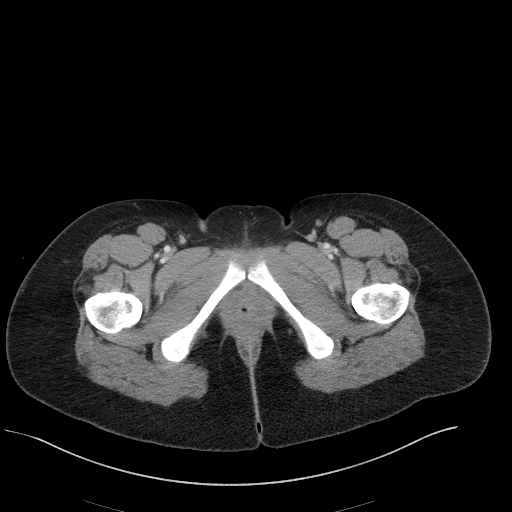
[im 19/98  soft-tissue]
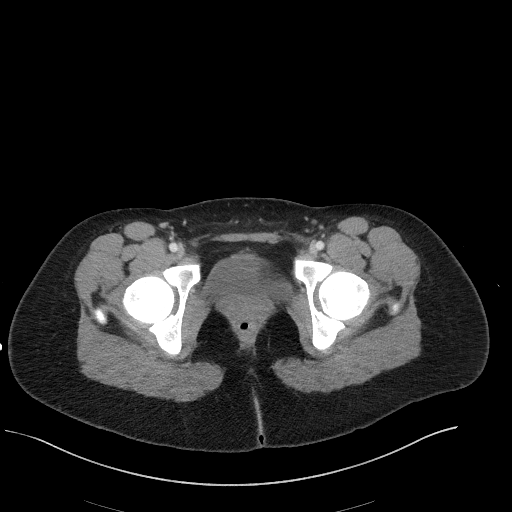
[im 27/98  soft-tissue]
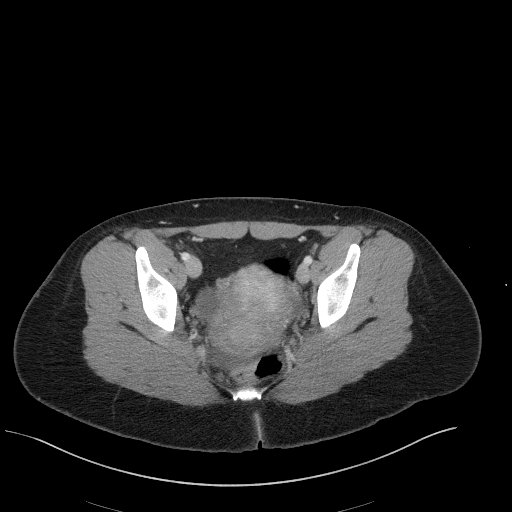
[im 34/98  soft-tissue]
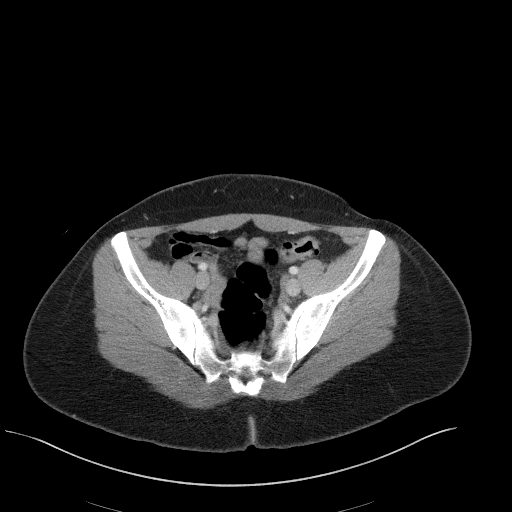
[im 42/98  soft-tissue]
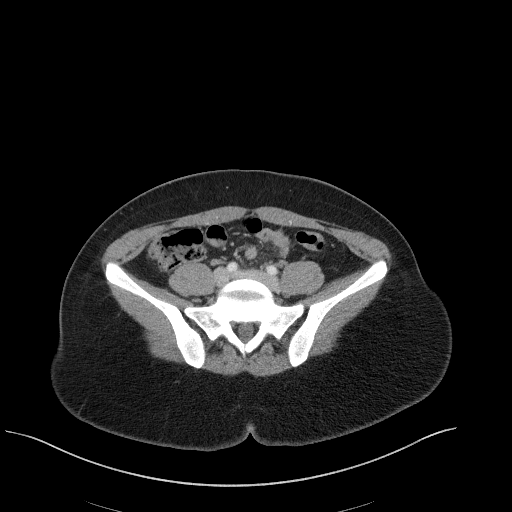
[im 49/98  soft-tissue]
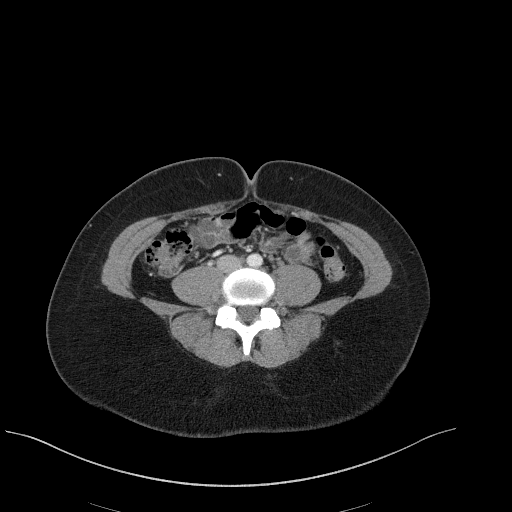
[im 56/98  soft-tissue]
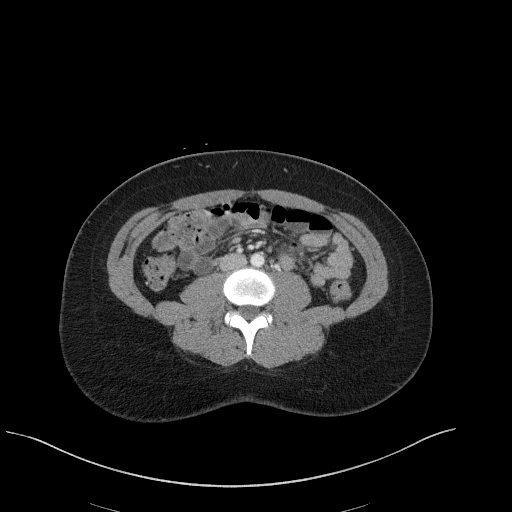
[im 64/98  soft-tissue]
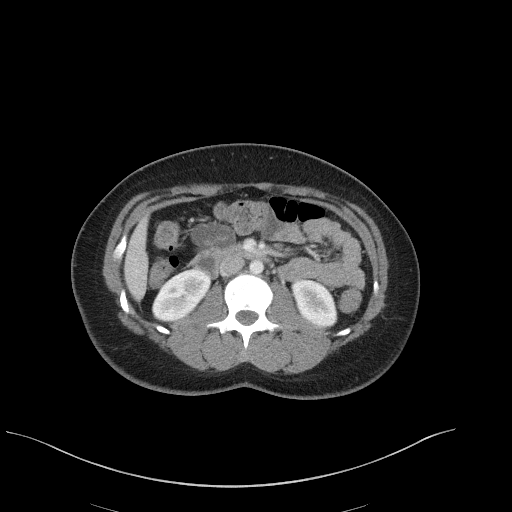
[im 64/98  bone]
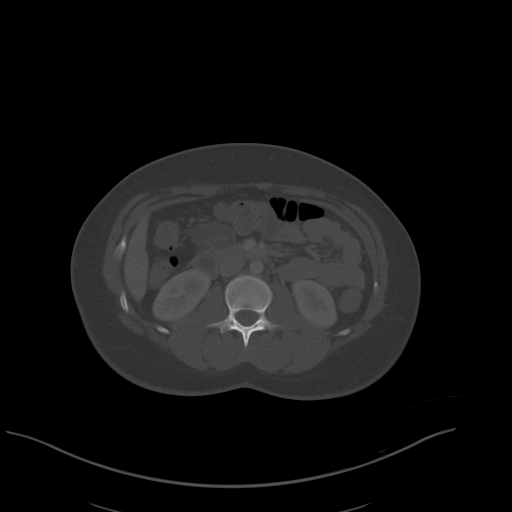
[im 71/98  soft-tissue]
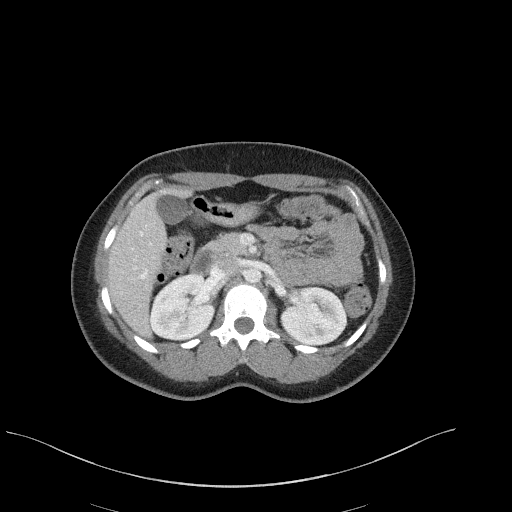
[im 79/98  soft-tissue]
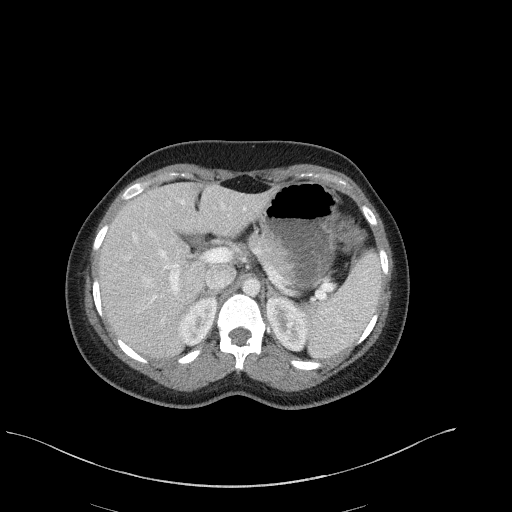
[im 86/98  soft-tissue]
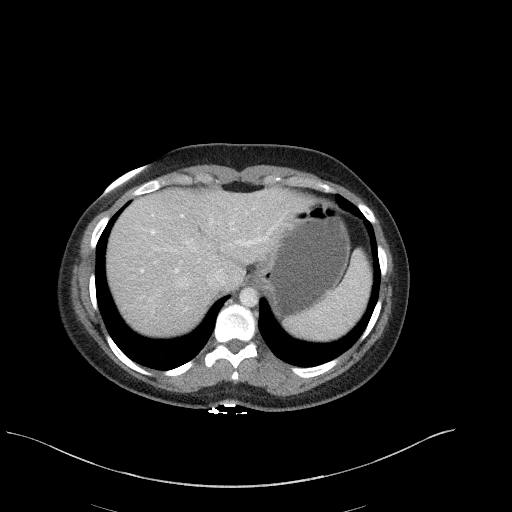
[im 94/98  soft-tissue]
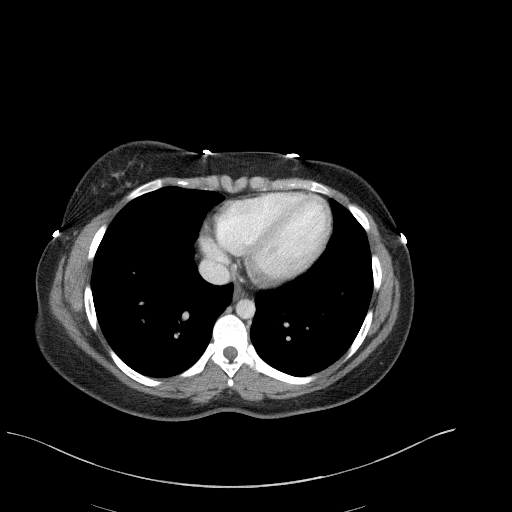

[Series 6: a/p w/ cor · coronal · 0.82mm/px · 3 of 150 slices shown]
[im 50/150  soft-tissue]
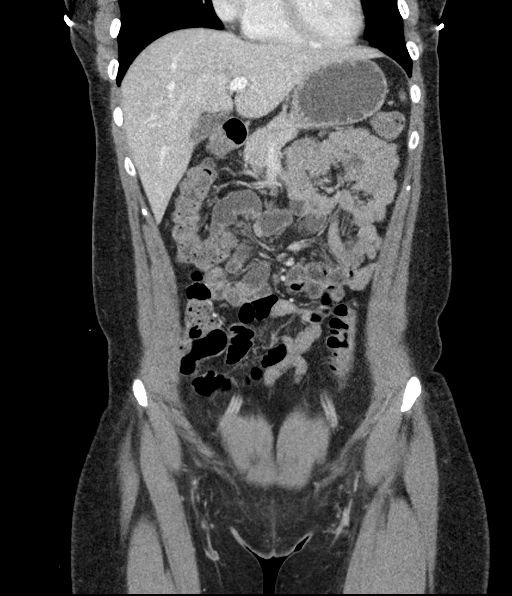
[im 67/150  soft-tissue]
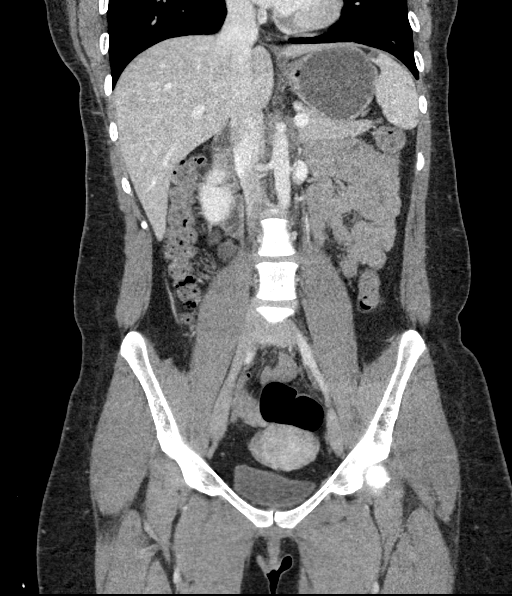
[im 83/150  soft-tissue]
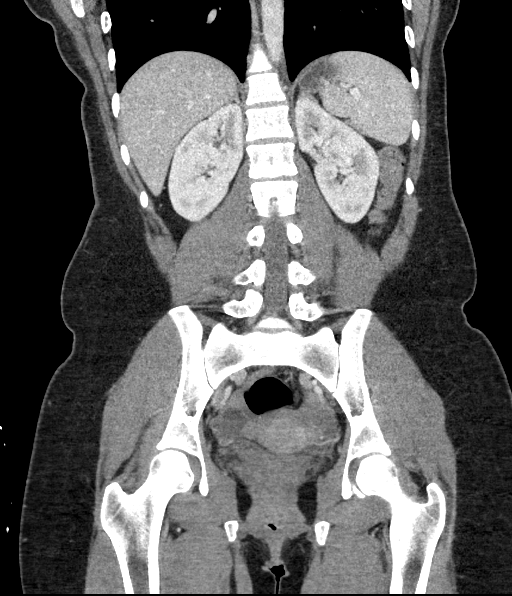

[16 of 46 positions shown; findings below may reference images not displayed]

FINDINGS: Lower chest: No acute abnormality.

Hepatobiliary: No focal liver abnormality is seen. No gallstones,
gallbladder wall thickening, or biliary dilatation.

Pancreas: Unremarkable. No pancreatic ductal dilatation or
surrounding inflammatory changes.

Spleen: Normal in size without focal abnormality.

Adrenals/Urinary Tract: The adrenal glands appear normal.

Unremarkable appearance of both kidneys. No mass or hydronephrosis.
Urinary bladder appears normal.

Stomach/Bowel: Stomach is within normal limits. Appendix appears
normal. No evidence of bowel wall thickening, distention, or
inflammatory changes.

Vascular/Lymphatic: Normal appearance of the abdominal aorta. No
enlarged retroperitoneal or mesenteric adenopathy. No enlarged
pelvic or inguinal lymph nodes.

Reproductive: Uterus and bilateral adnexa are unremarkable. Right
ovary cyst measures 2.3 cm.

Other: Trace free fluid within the posterior pelvis, nonspecific in
a premenopausal female.

Musculoskeletal: No aggressive lytic or sclerotic bone lesions.
IMPRESSION: 1. No acute findings identified within the abdomen or pelvis. No
evidence for acute diverticulitis.

## 2019-08-07 ENCOUNTER — Telehealth: Payer: Self-pay

## 2019-08-07 ENCOUNTER — Other Ambulatory Visit: Payer: Self-pay | Admitting: Obstetrics and Gynecology

## 2019-08-07 DIAGNOSIS — A6004 Herpesviral vulvovaginitis: Secondary | ICD-10-CM

## 2019-08-07 DIAGNOSIS — B009 Herpesviral infection, unspecified: Secondary | ICD-10-CM

## 2019-08-07 MED ORDER — VALACYCLOVIR HCL 500 MG PO TABS: 500.0000 mg | ORAL_TABLET | Freq: Two times a day (BID) | ORAL | 11 refills | Status: DC

## 2019-08-07 NOTE — Telephone Encounter (Signed)
Pt sees Dr. Glennon Mac, She states she is on Valtrex 500mg  once daily. In the past she was taking 500mg  twice a day. She states with the once a day she has noticed an outbreak more often. Can she get it switched to 500mg  BID?

## 2019-08-07 NOTE — Telephone Encounter (Signed)
Please let patient know I sent in Rx for 500 mg bid, as she requested. Thanks!

## 2019-08-08 NOTE — Telephone Encounter (Signed)
Pt aware.

## 2019-10-04 ENCOUNTER — Ambulatory Visit: Payer: Self-pay | Admitting: Urology

## 2019-10-04 ENCOUNTER — Encounter: Payer: Self-pay | Admitting: Urology

## 2020-02-05 ENCOUNTER — Ambulatory Visit: Payer: 59

## 2020-02-08 ENCOUNTER — Ambulatory Visit: Payer: 59 | Attending: Internal Medicine

## 2020-02-08 DIAGNOSIS — Z23 Encounter for immunization: Secondary | ICD-10-CM

## 2020-02-08 NOTE — Progress Notes (Signed)
   Covid-19 Vaccination Clinic  Name:  Deborah Weber    MRN: [id number] DOB: 26-Jul-1996  02/08/2020  Ms. Redford was observed post Covid-19 immunization for 30 minutes based on pre-vaccination screening without incident. She was provided with Vaccine Information Sheet and instruction to access the V-Safe system.   Ms. Steinbrecher was instructed to call 911 with any severe reactions post vaccine: Marland Kitchen Difficulty breathing  . Swelling of face and throat  . A fast heartbeat  . A bad rash all over body  . Dizziness and weakness   Immunizations Administered    Name Date Dose VIS Date Route   Pfizer COVID-19 Vaccine 02/08/2020 12:09 PM 0.3 mL 10/13/2019 Intramuscular   Manufacturer: [place]   Lot: [id number]   [place]: [id number]

## 2020-03-04 ENCOUNTER — Ambulatory Visit: Payer: 59 | Attending: Internal Medicine

## 2020-03-04 DIAGNOSIS — Z23 Encounter for immunization: Secondary | ICD-10-CM

## 2020-03-04 NOTE — Progress Notes (Signed)
   Covid-19 Vaccination Clinic  Name:  Deborah Weber    MRN: [id number] DOB: May 28, 1996  03/04/2020  [name] was observed post Covid-19 immunization for 30 minutes based on pre-vaccination screening without incident. She was provided with Vaccine Information Sheet and instruction to access the V-Safe system.   [name] was instructed to call 911 with any severe reactions post vaccine: Marland Kitchen Difficulty breathing  . Swelling of face and throat  . A fast heartbeat  . A bad rash all over body  . Dizziness and weakness   Immunizations Administered    Name Date Dose VIS Date [place] COVID-19 Vaccine 03/04/2020 12:08 PM 0.3 mL 12/27/2018 Intramuscular   Manufacturer: [place]   Lot: [id number]   [place]: [id number]

## 2020-03-28 ENCOUNTER — Ambulatory Visit
Admission: RE | Admit: 2020-03-28 | Discharge: 2020-03-28 | Disposition: A | Discharge status: Home / Self Care | Payer: 59 | Source: Ambulatory Visit | Attending: Sports Medicine | Admitting: Sports Medicine

## 2020-03-28 ENCOUNTER — Ambulatory Visit (INDEPENDENT_AMBULATORY_CARE_PROVIDER_SITE_OTHER): Payer: 59 | Admitting: Sports Medicine

## 2020-03-28 ENCOUNTER — Other Ambulatory Visit: Payer: Self-pay

## 2020-03-28 VITALS — BP 118/72 | Ht 68.0 in | Wt 175.0 lb

## 2020-03-28 DIAGNOSIS — M25572 Pain in left ankle and joints of left foot: Secondary | ICD-10-CM

## 2020-03-28 NOTE — Patient Instructions (Signed)
Dr. Wylene Simmer ([facility]) Dr. Radene Journey ([place])

## 2020-03-29 NOTE — Progress Notes (Signed)
   Subjective:    Patient ID: Deborah Weber, female    DOB: [birth year], 24 y.o.   MRN: [id number]  HPI chief complaint: Left ankle pain  Very pleasant 24 year old female comes in today complaining of chronic left ankle pain and swelling.  She is status post left ankle ORIF in 2014 after she suffered an ankle injury while playing basketball.  Her pain is diffuse around the ankle and she does not endorse some pain along the distal fibular shaft.  She states that ever since the surgery she has had intermittent pain and swelling which is worse with activity.  It is beginning to interfere with her activities of daily living.  She has not had any recent imaging.  Past medical history reviewed Medications reviewed Allergies reviewed    Review of Systems    As above Objective:   Physical Exam  Well-developed, well-nourished.  No acute distress.  Awake alert and oriented x3.  Vital signs reviewed  Left ankle: Patient has full ankle range of motion.  There is some diffuse swelling around the ankle.  She is tender to palpation directly over the medial malleolus but also tender to palpation along the distal fibular shaft.  Skin color and temperature are normal.  Good pulses.  Good strength.  Walking without significant limp.  X-rays of the left ankle including AP, lateral, and mortise views are reviewed.  Two view tib-fib is also reviewed.  There is evidence of an old distal fibular shaft fracture with abundant callus formation consistent with healed fracture in this area.  Hardware in the left ankle appears unremarkable.  I do question whether or not the mortise is anatomic as there is decreased clear space laterally which may be the result of slight angulation of the healed distal fibular shaft fracture.      Assessment & Plan:   Chronic left ankle pain and swelling status post ORIF in 2014  I would like to refer this patient to Dr. Lucia Gaskins for his input.  She certainly has some swelling on  my exam today and this is a chronic issue for her.  She may need further imaging of the ankle but I will defer this to Dr. Lucia Gaskins since she has hardware from her previous ORIF.  Patient is in agreement with this plan.  Follow-up with me as needed.

## 2020-11-27 ENCOUNTER — Other Ambulatory Visit: Payer: Self-pay

## 2020-11-27 ENCOUNTER — Encounter: Payer: Self-pay | Admitting: Obstetrics and Gynecology

## 2020-11-27 ENCOUNTER — Ambulatory Visit (INDEPENDENT_AMBULATORY_CARE_PROVIDER_SITE_OTHER): Payer: 59 | Admitting: Obstetrics and Gynecology

## 2020-11-27 VITALS — BP 141/81 | HR 106 | Ht 68.0 in | Wt 185.0 lb

## 2020-11-27 DIAGNOSIS — O209 Hemorrhage in early pregnancy, unspecified: Secondary | ICD-10-CM

## 2020-11-27 NOTE — Progress Notes (Signed)
Obstetrics & Gynecology Office Visit   Chief Complaint  Patient presents with  . Miscarriage    Spotting/cramping/second opinion [facility]    History of Present Illness: 25 y.o. G2P0010 who is noted to be pregnant.   She has had some bleeding and cramping.  The bleeding and cramping started at [redacted]w[redacted]d (1/10).  On 11/12/2020, she went to [facility] and was told she had a [facility] that measured 0.5 cm.  She went the following Friday (1/17)  and was told the [facility] was gone.  She went back on that Monday (1/21) and was told that the [facility] was present and at the same spot measuring 0.5 cm.  Since 11/22/20 she has continued to have cramping and bleeding.  She wants to know whether anything can be done.  She states they noted a heart beat on 11/22/20 and on 11/18/20.   She sometimes she will fill a panty liner, as fast as 30 minutes (that's the most bleeding she's had). More typically she has spotting that is pink or brown when she wipes. More recently she has been seeing a dark red.  Based on dating on 11/12/20 ([redacted]w[redacted]d) she has an EDD of 07/09/21 and is currently [redacted]w[redacted]d gestational age.  She is RH+.    Past Medical History:  Diagnosis Date  . Anemia   . 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 Gastritis   . Irritable bowel syndrome (IBS)   . Migraine   . Tachycardia     Past Surgical History:  Procedure Laterality Date  . ANKLE SURGERY Left   . BREAST BIOPSY Right 2013   excision fibroadenoma    Gynecologic History: Patient's last menstrual period was 10/03/2020.  Obstetric History: G2P0010  Family History  Problem Relation Age of Onset  . Colon cancer Paternal Aunt 15  . Breast cancer Paternal Grandmother 32    Social History   Socioeconomic History  . Marital status: Single    Spouse name: Not on file  . Number of children: Not on file  . Years of education: Not on file  . Highest education level: Not on file  Occupational History  . Not on file  Tobacco Use  . Smoking status:  Former Smoker    Quit date: 10/28/2020    Years since quitting: 0.0  . Smokeless tobacco: Never Used  . Tobacco comment: Quit w/+hpt  Substance and Sexual Activity  . Alcohol use: Not Currently    Comment: occasionally  . Drug use: No  . Sexual activity: Yes  Other Topics Concern  . Not on file  Social History Narrative  . Not on file   Social Determinants of Health   Financial Resource Strain: Not on file  Food Insecurity: Not on file  Transportation Needs: Not on file  Physical Activity: Not on file  Stress: Not on file  Social Connections: Not on file  Intimate Partner Violence: Not on file    Allergies  Allergen Reactions  . Ammonium-Containing Compounds   . Ibuprofen Nausea Only    gastritis  . Other     Gain brand cleaning    Prior to Admission medications   Medication Sig Start Date End Date Taking? Authorizing Provider  EPINEPHrine 0.3 mg/0.3 mL IJ SOAJ injection Inject 0.3 mg into the muscle as needed (for anaphylaxis shock).  09/07/16  Yes [provider]  citalopram (CELEXA) 40 MG tablet Take 40 mg by mouth daily. 03/22/18 03/22/19  [provider]  Melatonin 5  MG TABS Take 5 mg by mouth at bedtime. Patient not taking: Reported on 11/27/2020    [provider]  metoprolol succinate (TOPROL-XL) 25 MG 24 hr tablet Take 25 mg by mouth daily. 03/22/18   [provider]    Review of Systems  Constitutional: Negative.   HENT: Negative.   Eyes: Negative.  Negative for discharge.  Respiratory: Negative.   Cardiovascular: Negative.  Negative for orthopnea.  Gastrointestinal: Negative.   Genitourinary: Negative.        See HPI  Musculoskeletal: Negative.   Skin: Negative.   Neurological: Negative.   Psychiatric/Behavioral: Negative.      Physical Exam BP (!) 141/81 (BP Location: Right Arm, Patient Position: Sitting, Cuff Size: Normal)   Pulse (!) 106   Ht 5\' 8"  (1.727 m)   Wt 185 lb (83.9 kg)   LMP 10/03/2020   BMI 28.13  kg/m  Patient's last menstrual period was 10/03/2020. Physical Exam Constitutional:      General: She is not in acute distress.    Appearance: Normal appearance.  Genitourinary:     Genitourinary Comments: deferred  HENT:     Head: Normocephalic and atraumatic.  Eyes:     General: No scleral icterus.    Conjunctiva/sclera: Conjunctivae normal.  Neurological:     General: No focal deficit present.     Mental Status: She is alert and oriented to person, place, and time.     Cranial Nerves: No cranial nerve deficit.  Psychiatric:        Mood and Affect: Mood normal.        Behavior: Behavior normal.        Judgment: Judgment normal.   Bedside pelvic ultrasound (initially transabdominal which had to be converted to transvaginal) Single, living IUP with yolk sac and fetal heart rate of 150s.   CRL consistent with [redacted]w[redacted]d gestation. Small, left sided [place] measuring 6.5 x 3.5 mm.    Female chaperone present for pelvic and breast  portions of the physical exam  Assessment: 25 y.o. G2P0010 female here for  1. Vaginal bleeding in pregnancy, first trimester      Plan: Problem List Items Addressed This Visit   None   Visit Diagnoses    Vaginal bleeding in pregnancy, first trimester    -  Primary   Relevant Orders   US OB Comp Less 14 Wks     Discussed that vaginal bleeding can be common in first trimester.  There is no intervention or prevention of pregnancy loss with vaginal bleeding and subchorionic hemorrhage.  Will get a detailed, formal ultrasound to provide more information. However, it appears the [place] is still present. She is Rh positive and therefore rhogam is not indicated.  All questions answered.  A total of 30 minutes were spent face-to-face with the patient as well as preparation, review, communication, and documentation during this encounter.    Prentice Docker, MD 11/27/2020 5:24 PM

## 2020-12-03 ENCOUNTER — Ambulatory Visit: Payer: 59 | Admitting: Obstetrics and Gynecology

## 2020-12-03 ENCOUNTER — Ambulatory Visit: Payer: 59

## 2020-12-03 DIAGNOSIS — O209 Hemorrhage in early pregnancy, unspecified: Secondary | ICD-10-CM

## 2020-12-05 ENCOUNTER — Ambulatory Visit (INDEPENDENT_AMBULATORY_CARE_PROVIDER_SITE_OTHER): Payer: 59

## 2020-12-05 ENCOUNTER — Other Ambulatory Visit: Payer: Self-pay

## 2020-12-05 ENCOUNTER — Encounter: Payer: Self-pay | Admitting: Obstetrics

## 2020-12-05 ENCOUNTER — Ambulatory Visit (INDEPENDENT_AMBULATORY_CARE_PROVIDER_SITE_OTHER): Payer: 59 | Admitting: Obstetrics

## 2020-12-05 VITALS — BP 100/64 | Ht 68.0 in | Wt 186.0 lb

## 2020-12-05 DIAGNOSIS — O468X1 Other antepartum hemorrhage, first trimester: Secondary | ICD-10-CM

## 2020-12-05 DIAGNOSIS — O418X10 Other specified disorders of amniotic fluid and membranes, first trimester, not applicable or unspecified: Secondary | ICD-10-CM | POA: Diagnosis not present

## 2020-12-05 DIAGNOSIS — O209 Hemorrhage in early pregnancy, unspecified: Secondary | ICD-10-CM

## 2020-12-05 DIAGNOSIS — Z3480 Encounter for supervision of other normal pregnancy, unspecified trimester: Secondary | ICD-10-CM

## 2020-12-05 NOTE — Progress Notes (Signed)
Obstetrics & Gynecology Office Visit   Chief Complaint:  Chief Complaint  Patient presents with  . Follow-up    U/S    History of Present Illness: Deborah Weber is seen today for an ultrasound following a visit to the ED at [facility] as well as some initial visits at [facility]. She is currently 8+ weeks gestation, and had moderate to heavy vaginal bleeding that started several weeks ago. When seen both at [facility] and at the hospital, a subchorionic hemorrhage was noted on her ultrasound.She  Is here today for follow up and another ultrasound. Today she reports that she stopped bleeding about a week ago. She denies any LOF or cramping.  She shares that her poan tis to have ongoing care with a different OB GYN in [place], and not at [facility].  Review of Systems:  Review of Systems  Constitutional: Negative.   Eyes: Negative.   Cardiovascular: Negative.   Gastrointestinal: Negative.   Genitourinary: Negative.   Musculoskeletal: Negative.   Skin: Negative.   Neurological: Negative.   Endo/Heme/Allergies: Negative.   Psychiatric/Behavioral: Negative.      Past Medical History:  Past Medical History:  Diagnosis Date  . Anemia   . 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 Gastritis   . Irritable bowel syndrome (IBS)   . Migraine   . Tachycardia     Past Surgical History:  Past Surgical History:  Procedure Laterality Date  . ANKLE SURGERY Left   . BREAST BIOPSY Right 2013   excision fibroadenoma    Gynecologic History: No LMP recorded. (Menstrual status: Other).  Obstetric History: G2P0010  Family History:  Family History  Problem Relation Age of Onset  . Colon cancer Paternal Aunt 57  . Breast cancer Paternal Grandmother 89    Social History:  Social History   Socioeconomic History  . Marital status: Single    Spouse name: Not on file  . Number of children: Not on file  . Years of education: Not on file  . Highest education level: Not on  file  Occupational History  . Not on file  Tobacco Use  . Smoking status: Former Smoker    Quit date: 10/28/2020    Years since quitting: 0.1  . Smokeless tobacco: Never Used  . Tobacco comment: Quit w/+hpt  Substance and Sexual Activity  . Alcohol use: Not Currently    Comment: occasionally  . Drug use: No  . Sexual activity: Yes  Other Topics Concern  . Not on file  Social History Narrative  . Not on file   Social Determinants of Health   Financial Resource Strain: Not on file  Food Insecurity: Not on file  Transportation Needs: Not on file  Physical Activity: Not on file  Stress: Not on file  Social Connections: Not on file  Intimate Partner Violence: Not on file    Allergies:  Allergies  Allergen Reactions  . Ammonium-Containing Compounds   . Ibuprofen Nausea Only    gastritis  . Other     Gain brand cleaning    Medications: Prior to Admission medications   Medication Sig Start Date End Date Taking? Authorizing Provider  citalopram (CELEXA) 40 MG tablet Take 40 mg by mouth daily. 03/22/18 03/22/19  [provider]  EPINEPHrine 0.3 mg/0.3 mL IJ SOAJ injection Inject 0.3 mg into the muscle as needed (for anaphylaxis shock).  09/07/16   [provider]    Physical Exam Vitals:  Vitals:  12/05/20 1623  BP: 100/64   No LMP recorded. (Menstrual status: Other).  Physical Exam Constitutional:      Appearance: Normal appearance. She is normal weight.  HENT:     Head: Atraumatic.     Nose: Nose normal.  Cardiovascular:     Rate and Rhythm: Normal rate and regular rhythm.  Pulmonary:     Effort: Pulmonary effort is normal.     Breath sounds: Normal breath sounds.  Genitourinary:    Comments: deferred Musculoskeletal:        General: Normal range of motion.     Cervical back: Normal range of motion and neck supple.  Skin:    General: Skin is warm and dry.  Neurological:     General: No focal deficit present.     Mental Status: She is  alert and oriented to person, place, and time.  Psychiatric:        Mood and Affect: Mood normal.        Behavior: Behavior normal.     Limited today  Ultrasound report:   IUP at 8 w5d with FHTs at 169 CRL is 20.9 mm Yolk sac visualized No obvious [place] seen today   See sono report   Assessment: 25 y.o. G2P0010  At 8 w5d gestation.  Resolved subchorionic hemorrhage Reassuring FHTS   Plan: Problem List Items Addressed This Visit      Other   Supervision of other normal pregnancy, antepartum     She is reassured by today's sono report. Plan for her to initiate prenatal care with her OB of choice in [place]. Advised to have consents for transfer of records to the other office.   Imagene Riches, CNM  12/05/2020 6:07 PM

## 2021-02-25 ENCOUNTER — Inpatient Hospital Stay (HOSPITAL_COMMUNITY)
Admission: AD | Admit: 2021-02-25 | Discharge: 2021-02-25 | Disposition: A | Discharge status: Home / Self Care | Payer: 59 | Attending: Obstetrics and Gynecology | Admitting: Obstetrics and Gynecology

## 2021-02-25 ENCOUNTER — Encounter (HOSPITAL_COMMUNITY): Payer: Self-pay | Admitting: Obstetrics and Gynecology

## 2021-02-25 ENCOUNTER — Other Ambulatory Visit: Payer: Self-pay

## 2021-02-25 DIAGNOSIS — Z886 Allergy status to analgesic agent status: Secondary | ICD-10-CM | POA: Diagnosis not present

## 2021-02-25 DIAGNOSIS — Z3A21 21 weeks gestation of pregnancy: Secondary | ICD-10-CM | POA: Insufficient documentation

## 2021-02-25 DIAGNOSIS — O4290 Premature rupture of membranes, unspecified as to length of time between rupture and onset of labor, unspecified weeks of gestation: Secondary | ICD-10-CM

## 2021-02-25 DIAGNOSIS — Z87891 Personal history of nicotine dependence: Secondary | ICD-10-CM | POA: Diagnosis not present

## 2021-02-25 DIAGNOSIS — O26892 Other specified pregnancy related conditions, second trimester: Secondary | ICD-10-CM | POA: Insufficient documentation

## 2021-02-25 LAB — URINALYSIS, ROUTINE W REFLEX MICROSCOPIC
Bilirubin Urine: NEGATIVE
Glucose, UA: NEGATIVE mg/dL
Hgb urine dipstick: NEGATIVE
Ketones, ur: NEGATIVE mg/dL
Leukocytes,Ua: NEGATIVE
Nitrite: NEGATIVE
Protein, ur: NEGATIVE mg/dL
Specific Gravity, Urine: 1.01 (ref 1.005–1.030)
pH: 6 (ref 5.0–8.0)

## 2021-02-25 NOTE — Discharge Instructions (Signed)
Prelabor Rupture and Preterm Prelabor Rupture of Membranes  A sac made up of membranes and fluid surrounds your baby in the womb (uterus). Rupture of membranes is when this sac breaks open. This is also known as your "water breaking." When this sac breaks before labor starts, it is called prelabor rupture of membranes (PROM). If this happens before 37 weeks of being pregnant, it is called preterm prelabor rupture of membranes (PPROM). PPROM is serious. It creates health dangers for the mother and the baby. These include a higher risk of:  Needing to have a cesarean delivery. This is also called a C-section.  A serious infection for both the mother and the baby.  A lung problem called respiratory distress syndrome in the baby.  The baby's lungs not growing as much as normal.  Bleeding in the baby's brain.  The baby dying. PPROM needs medical care right away. What are the causes? When PROM happens at 37 weeks of pregnancy or later, it is usually caused by normal weakening of the membranes along with contractions. PPROM is usually caused by infection. In many cases, the cause is not known. What increases the risk? PPROM is more likely to happen in women who:  Have an infection.  Have had PPROM before.  Have a cervix that is short. The cervix is the lowest part of the womb.  Have bleeding during the second or third trimester.  Have a low BMI. This is a measure of body fat.  Smoke.  Use drugs. What are the signs or symptoms? Signs of PROM and PPROM include:  A sudden gush of fluid from the vagina.  A slow leak of fluid from the vagina.  Your underwear being wet often. How is this treated? Treatment will depend on many things, including how far along you are in your pregnancy. It may include:  Taking steps to cause you to start labor. This may be done if: ? You have PROM. In this case, your doctor may try to start labor within 24 hours if you are not having  contractions. ? You have PPROM that happens after you have reached the 34th week of pregnancy. Your doctor may try to start labor if you are not having contractions. ? You have PPROM that happens before the 34th week of pregnancy and there are problems for you or the baby.  Watching you and the baby closely for signs of infection or other problems.  Medicines, such as: ? An antibiotic to lower the chances of getting an infection. ? A steroid to help the baby's lungs develop faster. ? A medicine to help prevent cerebral palsy in your baby. Follow these instructions at home: You may need to stay in the hospital. If you are allowed to go home, follow instructions from your doctor. Make sure you:  Rest as told by your doctor.  Take over-the-counter and prescription medicines only as told by your doctor.  Do not use any products that contain nicotine or tobacco. These include cigarettes, e-cigarettes, and chewing tobacco. If you need help quitting, ask your doctor.  Do not drink alcohol.  Return to the hospital as told by your doctor. Contact a doctor if:  You have a sudden gush or slow leaking of fluid from the vagina after 37 weeks of pregnancy.  You have constant wet underwear after 37 weeks of pregnancy. Get help right away if:  Your water breaks before you are [redacted] weeks pregnant.  You have signs of an infection, such as: ?  Fever. ? Chills. ? Body aches. ? Belly pain.  You have signs of labor, such as: ? Belly pain. ? Cramping that feels like menstrual cramps.  You have bleeding from the vagina.  The color of your vaginal fluid changes from clear to green, brown, or bloody.  You see the umbilical cord sticking out from your vagina or you feel the cord in your vagina. Summary  When your water breaks before labor starts, it is called premature rupture of membranes (PROM).  When PROM happens before 37 weeks of pregnancy, it is called preterm premature rupture of membranes  (PPROM).  PPROM creates health dangers for the mother and the baby.  Follow instructions from your doctor about when to seek help. This information is not intended to replace advice given to you by your health care provider. Make sure you discuss any questions you have with your health care provider. Document Revised: 11/09/2019 Document Reviewed: 11/09/2019 Elsevier Patient Education  2021 [employer].

## 2021-02-25 NOTE — MAU Provider Note (Addendum)
History     Chief Complaint  Patient presents with  . Rupture of Membranes  . Abdominal Pain   25 yo G3P0020 BF @ 21 4/[redacted] wk gestation presents with complaint of leakage of fluid at 8 pm tonight. Some cramping. (+) small mucus here  OB History    Gravida  3   Para      Term      Preterm      AB  1   Living        SAB  1   IAB      Ectopic      Multiple      Live Births           Obstetric Comments  1st Menstrual Cycle:  13  1st Pregnancy:  0        Past Medical History:  Diagnosis Date  . Anemia   . 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 Gastritis   . Irritable bowel syndrome (IBS)   . Migraine   . Tachycardia     Past Surgical History:  Procedure Laterality Date  . ANKLE SURGERY Left   . BREAST BIOPSY Right 2013   excision fibroadenoma    Family History  Problem Relation Age of Onset  . Colon cancer Paternal Aunt 65  . Breast cancer Paternal Grandmother 56    Social History   Tobacco Use  . Smoking status: Former Smoker    Quit date: 10/28/2020    Years since quitting: 0.3  . Smokeless tobacco: Never Used  . Tobacco comment: Quit w/+hpt  Substance Use Topics  . Alcohol use: Not Currently    Comment: occasionally  . Drug use: No    Allergies:  Allergies  Allergen Reactions  . Ammonium-Containing Compounds   . Ibuprofen Nausea Only    gastritis  . Other     Gain brand cleaning    Medications Prior to Admission  Medication Sig Dispense Refill Last Dose  . Prenatal Vit-Fe Fumarate-FA (MULTIVITAMIN-PRENATAL) 27-0.8 MG TABS tablet Take 1 tablet by mouth daily at 12 noon.   02/25/2021 at Unknown time  . valGANciclovir (VALCYTE) 450 MG tablet Take by mouth daily.   02/25/2021 at Unknown time  . citalopram (CELEXA) 40 MG tablet Take 40 mg by mouth daily.     Marland Kitchen EPINEPHrine 0.3 mg/0.3 mL IJ SOAJ injection Inject 0.3 mg into the muscle as needed (for anaphylaxis shock).         Physical Exam   Blood pressure  124/69, pulse 91, temperature 98.3 F (36.8 C), temperature source Oral, resp. rate 16, height 5\' 8"  (1.727 m), weight 85.3 kg, last menstrual period 10/03/2020, SpO2 100 %.  General appearance: alert, cooperative and no distress Abdomen: soft gravid uterus 2FB above umb nontender Pelvic: cervix normal in appearance, external genitalia normal and cervix visually closed. scant white discharge noted. no leakage with coughing. fern  test done  [facility] neg. FHR 158 bpm ED Course  IMP;  Leaking of amniotic fluid IUP @ 21 4/7 weeks P) no evidence of leakage. D/c home. Keep scheduled ob appt MDM   Marvene Staff, MD 10:43 PM 02/25/2021

## 2021-02-25 NOTE — MAU Note (Signed)
.  Deborah Weber is a 25 y.o. at [redacted]w[redacted]d here in MAU reporting: At 8pm after she took a shower and tried off she noticed a lot of clear liquid on her underwear. Reports abdominal cramping that began around 6pm. +FM  Pain score: 5/1 Vitals:   02/25/21 2152  BP: 124/69  Pulse: 91  Resp: 16  Temp: 98.3 F (36.8 C)  SpO2: 100%

## 2021-05-19 ENCOUNTER — Other Ambulatory Visit: Payer: Self-pay

## 2021-05-19 ENCOUNTER — Inpatient Hospital Stay (HOSPITAL_COMMUNITY)
Admission: AD | Admit: 2021-05-19 | Discharge: 2021-05-19 | Disposition: A | Discharge status: Home / Self Care | Payer: 59 | Attending: Obstetrics and Gynecology | Admitting: Obstetrics and Gynecology

## 2021-05-19 ENCOUNTER — Encounter (HOSPITAL_COMMUNITY): Payer: Self-pay | Admitting: Obstetrics and Gynecology

## 2021-05-19 DIAGNOSIS — Z3A33 33 weeks gestation of pregnancy: Secondary | ICD-10-CM | POA: Diagnosis not present

## 2021-05-19 DIAGNOSIS — O4703 False labor before 37 completed weeks of gestation, third trimester: Secondary | ICD-10-CM

## 2021-05-19 LAB — URINALYSIS, ROUTINE W REFLEX MICROSCOPIC
Bilirubin Urine: NEGATIVE
Glucose, UA: NEGATIVE mg/dL
Hgb urine dipstick: NEGATIVE
Ketones, ur: NEGATIVE mg/dL
Leukocytes,Ua: NEGATIVE
Nitrite: NEGATIVE
Protein, ur: NEGATIVE mg/dL
Specific Gravity, Urine: 1.004 — ABNORMAL LOW (ref 1.005–1.030)
pH: 7 (ref 5.0–8.0)

## 2021-05-19 NOTE — MAU Note (Signed)
Presents c/o ctxs since 1230 this afternoon, reports less than 10 minutes apart.  Denies VB or LOF.  Endorses +FM.

## 2021-05-19 NOTE — MAU Provider Note (Signed)
History     Chief Complaint  Patient presents with   Contractions   25 yo G3P0020 SBF @33  3/[redacted] week gestation sent in for evaluation due to complaint of ctx q 5-9 mins since 12 n. (+) FM no vaginal bleeding or leakage of fluid.  OB History     Gravida  3   Para      Term      Preterm      AB  1   Living         SAB  1   IAB      Ectopic      Multiple      Live Births           Obstetric Comments  1st Menstrual Cycle:  13  1st Pregnancy:  0         Past Medical History:  Diagnosis Date   Anemia    Benign neoplasm of breast    fibroadenoma right breast   Gastritis    Irritable bowel syndrome (IBS)    Migraine    Tachycardia     Past Surgical History:  Procedure Laterality Date   ANKLE SURGERY Left    BREAST BIOPSY Right 2013   excision fibroadenoma    Family History  Problem Relation Age of Onset   Colon cancer Paternal Aunt 42   Breast cancer Paternal Grandmother 60    Social History   Tobacco Use   Smoking status: Former    Types: Cigarettes    Quit date: 10/28/2020    Years since quitting: 0.5   Smokeless tobacco: Never   Tobacco comments:    Quit w/+hpt  Substance Use Topics   Alcohol use: Not Currently    Comment: occasionally   Drug use: No    Allergies:  Allergies  Allergen Reactions   Ammonium-Containing Compounds    Ibuprofen Nausea Only    gastritis   Other     Gain brand cleaning    Medications Prior to Admission  Medication Sig Dispense Refill Last Dose   Prenatal Vit-Fe Fumarate-FA (MULTIVITAMIN-PRENATAL) 27-0.8 MG TABS tablet Take 1 tablet by mouth daily at 12 noon.   05/19/2021   EPINEPHrine 0.3 mg/0.3 mL IJ SOAJ injection Inject 0.3 mg into the muscle as needed (for anaphylaxis shock).       valGANciclovir (VALCYTE) 450 MG tablet Take by mouth daily.        Physical Exam   Blood pressure 106/73, pulse 91, temperature 98.1 F (36.7 C), temperature source Oral, resp. rate 18, height 5\' 9"  (1.753 m),  weight 91.1 kg, last menstrual period 10/03/2020, SpO2 99 %.  General appearance: alert, cooperative, and no distress Lungs: clear to auscultation bilaterally Heart: regular rate and rhythm, S1, S2 normal, no murmur, click, rub or gallop Abdomen:  gravid soft Pelvic: external genitalia normal and cervix long/closed  Tracing baseline 140 (+) accel to 170 marked variability One ctx on the monitor ED Course  IMP: Premature contraction IUP @ 33 3/7 week P) d/c home . PTL prec MDM   Marvene Staff, MD 7:31 PM 05/19/2021

## 2021-06-23 ENCOUNTER — Inpatient Hospital Stay (HOSPITAL_BASED_OUTPATIENT_CLINIC_OR_DEPARTMENT_OTHER): Payer: 59

## 2021-06-23 ENCOUNTER — Other Ambulatory Visit: Payer: Self-pay

## 2021-06-23 ENCOUNTER — Inpatient Hospital Stay (HOSPITAL_COMMUNITY)
Admission: AD | Admit: 2021-06-23 | Discharge: 2021-06-23 | Disposition: A | Discharge status: Home / Self Care | Payer: 59 | Source: Ambulatory Visit | Attending: Obstetrics and Gynecology | Admitting: Obstetrics and Gynecology

## 2021-06-23 ENCOUNTER — Encounter (HOSPITAL_COMMUNITY): Payer: Self-pay | Admitting: Obstetrics and Gynecology

## 2021-06-23 DIAGNOSIS — O321XX0 Maternal care for breech presentation, not applicable or unspecified: Secondary | ICD-10-CM

## 2021-06-23 DIAGNOSIS — Z3A37 37 weeks gestation of pregnancy: Secondary | ICD-10-CM | POA: Diagnosis not present

## 2021-06-23 DIAGNOSIS — O288 Other abnormal findings on antenatal screening of mother: Secondary | ICD-10-CM

## 2021-06-23 DIAGNOSIS — O368130 Decreased fetal movements, third trimester, not applicable or unspecified: Secondary | ICD-10-CM | POA: Insufficient documentation

## 2021-06-23 NOTE — Discharge Instructions (Signed)
Labor prec

## 2021-06-23 NOTE — MAU Note (Signed)
Patient arrived to MAU from Dr. Garwin Brothers office complaining of decreased fetal movement since yesterday at 10am. Patient stated that she ate and drink yesterday. She also stated that she counted 10 in about 2 hour time span.  Patient reports she felt the baby move 2x today. No Vaginal bleeding, leakage of fluid. Patient reports feeling light contractions but no pain.

## 2021-06-23 NOTE — MAU Provider Note (Signed)
History     Chief Complaint  Patient presents with   Decreased Fetal Movement  25 yo G3P0020 BF @ 38 3/[redacted] weeks gestation ( EDC 07/04/2021)presents for additional testing due to  C/o decreased FM. Pt presented to the office with c/o leaking fluid for several days And DFM since yesterday. Known breech presentation. Office eval had NR NST. Fern and Nitrazine neg. GBS cx (+)   OB History     Gravida  3   Para      Term      Preterm      AB  1   Living         SAB  1   IAB      Ectopic      Multiple      Live Births           Obstetric Comments  1st Menstrual Cycle:  13  1st Pregnancy:  0         Past Medical History:  Diagnosis Date   Anemia    Benign neoplasm of breast    fibroadenoma right breast   Gastritis    Irritable bowel syndrome (IBS)    Migraine    Tachycardia     Past Surgical History:  Procedure Laterality Date   ANKLE SURGERY Left    BREAST BIOPSY Right 2013   excision fibroadenoma    Family History  Problem Relation Age of Onset   Hyperlipidemia Mother    Hypertension Father    Heart attack Father    Liver disease Father    Colon cancer Paternal Aunt 67   Breast cancer Paternal Grandmother 74    Social History   Tobacco Use   Smoking status: Former    Types: Cigarettes    Quit date: 10/28/2020    Years since quitting: 0.6   Smokeless tobacco: Never   Tobacco comments:    Quit w/+hpt  Substance Use Topics   Alcohol use: Not Currently    Comment: occasionally   Drug use: No    Allergies:  Allergies  Allergen Reactions   Ammonium-Containing Compounds    Ibuprofen Nausea Only    gastritis   Other     Gain brand cleaning    Medications Prior to Admission  Medication Sig Dispense Refill Last Dose   ferrous sulfate 325 (65 FE) MG tablet Take 325 mg by mouth daily with breakfast.      Prenatal Vit-Fe Fumarate-FA (MULTIVITAMIN-PRENATAL) 27-0.8 MG TABS tablet Take 1 tablet by mouth daily at 12 noon.   06/22/2021    valGANciclovir (VALCYTE) 450 MG tablet Take by mouth daily.   06/22/2021   EPINEPHrine 0.3 mg/0.3 mL IJ SOAJ injection Inject 0.3 mg into the muscle as needed (for anaphylaxis shock).         Physical Exam   Blood pressure 137/84, pulse 95, temperature 98.5 F (36.9 C), temperature source Oral, resp. rate 17, height '5\' 9"'$  (1.753 m), weight 92.2 kg, last menstrual period 10/03/2020, SpO2 99 %.  General appearance: alert, cooperative, and no distress Abdomen:  gravid soft Tracing: baseline 150-155. (+) accel to 160-165 no ctx   ED Course  IMP: NR NST Decreased FM Breech presentation IUP @ 38 3/7 weeks P)  NST reviewed. BPP ordered MDM  Addendum [place] MFM Fetal BPP Wo Non Stress  Result Date: 06/23/2021 ----------------------------------------------------------------------  OBSTETRICS REPORT                       (  Signed Final 06/23/2021 04:48 pm) ---------------------------------------------------------------------- Patient Info  ID #:       [id number]                          D.O.B.:  [birth year] (25 yrs)  Name:       Deborah Weber [facility]             Visit Date: 06/23/2021 12:46 pm ---------------------------------------------------------------------- Performed By  Attending:        Sander Nephew      Ref. Address:     Pam Drown                    MD                                                             OB/GYN                                                             [address]                                                             [place]                                                             [place]  Performed By:     Germain Osgood            [facility]  Referred By:      Alanda Slim                    Marialuisa Basara MD ---------------------------------------------------------------------- Orders  #   Description                           Code        Ordered By  1  [place] MFM FETAL  BPP WO NON               76819.01    Clarie Camey     STRESS                                            Tharon Kitch ----------------------------------------------------------------------  #  Order #                     Accession #                Episode #  1  [id number]                   [id number]                 [id number] ---------------------------------------------------------------------- Indications  [redacted] weeks gestation of pregnancy                Z3A.37  Decreased fetal movements, third trimester,    O36.8130  unspecified ---------------------------------------------------------------------- Fetal Evaluation  Num Of Fetuses:         1  Fetal Heart Rate(bpm):  144  Cardiac Activity:       Observed  Presentation:           Breech  Placenta:               Fundal  P. Cord Insertion:      Visualized  Amniotic Fluid  AFI FV:      Subjectively low-normal  AFI Sum(cm)     %Tile       Largest Pocket(cm)  6               < 3         3  RUQ(cm)       RLQ(cm)       LUQ(cm)        LLQ(cm)  2.5           0             3              0.5 ---------------------------------------------------------------------- Biophysical Evaluation  Amniotic F.V:   Within normal limits       F. Tone:        Observed  F. Movement:    Observed                   Score:          8/8  F. Breathing:   Observed ---------------------------------------------------------------------- Gestational Age  LMP:           37w 4d        Date:  10/03/20                 EDD:   07/10/21  Best:          37w 4d     Det. By:  LMP  (10/03/20)          EDD:   07/10/21 ---------------------------------------------------------------------- Anatomy  Diaphragm:             Appears normal         Kidneys:                Appear normal  Stomach:               Appears normal, left  Bladder:                Appears normal                         sided  ---------------------------------------------------------------------- Cervix Uterus Adnexa  Cervix  Not visualized (advanced GA >24wks) ---------------------------------------------------------------------- Impression  Antenatal testing performed given decreased fetal movement.  The biophysical profile was 8/8 with good fetal movement and  amniotic fluid volume, but subjectively low. ---------------------------------------------------------------------- Recommendations  Clinical correlation recommended. ----------------------------------------------------------------------               Sander Nephew, MD Electronically Signed Final Report   06/23/2021 04:48 pm ----------------------------------------------------------------------   Reassuring fetal testing. D/c home. Labor prec. Keep scheduled OB appt  Marvene Staff, MD 3:26 PM 06/23/2021

## 2021-06-25 ENCOUNTER — Encounter (HOSPITAL_COMMUNITY): Payer: Self-pay

## 2021-06-25 NOTE — Patient Instructions (Signed)
Deborah Weber  06/25/2021   Your procedure is scheduled on:  06/28/2021  Arrive at 77 at Entrance C on [address] at [facility]  and [employer] [employer] [address]. You are invited to use the FREE valet parking or use the Visitor's parking deck.  Pick up the phone at the desk and dial [phone].  Call this number if you have problems the morning of surgery: [phone]  Remember:   Do not eat food:(After Midnight) Desps de medianoche.  Do not drink clear liquids: (After Midnight) Desps de medianoche.  Take these medicines the morning of surgery with A SIP OF WATER:  valtrex   Do not wear jewelry, make-up or nail polish.  Do not wear lotions, powders, or perfumes. Do not wear deodorant.  Do not shave 48 hours prior to surgery.  Do not bring valuables to the hospital.  [facility] is not   responsible for any belongings or valuables brought to the hospital.  Contacts, dentures or bridgework may not be worn into surgery.  Leave suitcase in the car. After surgery it may be brought to your room.  For patients admitted to the hospital, checkout time is 11:00 AM the day of              discharge.      Please read over the following fact sheets that you were given:     Preparing for Surgery

## 2021-06-26 ENCOUNTER — Other Ambulatory Visit: Payer: Self-pay

## 2021-06-26 ENCOUNTER — Encounter (HOSPITAL_COMMUNITY)
Admission: RE | Admit: 2021-06-26 | Discharge: 2021-06-26 | Disposition: A | Discharge status: Home / Self Care | Payer: 59 | Source: Ambulatory Visit | Attending: Obstetrics and Gynecology | Admitting: Obstetrics and Gynecology

## 2021-06-26 ENCOUNTER — Other Ambulatory Visit: Payer: Self-pay | Admitting: Obstetrics and Gynecology

## 2021-06-26 DIAGNOSIS — Z01812 Encounter for preprocedural laboratory examination: Secondary | ICD-10-CM | POA: Insufficient documentation

## 2021-06-26 LAB — CBC
HCT: 33 % — ABNORMAL LOW (ref 36.0–46.0)
Hemoglobin: 11.4 g/dL — ABNORMAL LOW (ref 12.0–15.0)
MCH: 24.7 pg — ABNORMAL LOW (ref 26.0–34.0)
MCHC: 34.5 g/dL (ref 30.0–36.0)
MCV: 71.6 fL — ABNORMAL LOW (ref 80.0–100.0)
Platelets: 283 10*3/uL (ref 150–400)
RBC: 4.61 MIL/uL (ref 3.87–5.11)
RDW: 15.1 % (ref 11.5–15.5)
WBC: 7.3 10*3/uL (ref 4.0–10.5)
nRBC: 0 % (ref 0.0–0.2)

## 2021-06-26 LAB — TYPE AND SCREEN
ABO/RH(D): O POS
Antibody Screen: NEGATIVE

## 2021-06-26 LAB — SARS CORONAVIRUS 2 (TAT 6-24 HRS): SARS Coronavirus 2: NEGATIVE

## 2021-06-27 LAB — RPR: RPR Ser Ql: NONREACTIVE

## 2021-06-27 NOTE — Anesthesia Preprocedure Evaluation (Addendum)
Anesthesia Evaluation  Patient identified by MRN, date of birth, ID band Patient awake    Reviewed: Allergy & Precautions, NPO status , Patient's Chart, lab work & pertinent test results  History of Anesthesia Complications Negative for: history of anesthetic complications  Airway Mallampati: II  TM Distance: >3 FB Neck ROM: Full    Dental  (+) Dental Advisory Given, Teeth Intact   Pulmonary former smoker,    Pulmonary exam normal        Cardiovascular negative cardio ROS Normal cardiovascular exam     Neuro/Psych  Headaches, negative psych ROS   GI/Hepatic Neg liver ROS,  IBS    Endo/Other   Obesity   Renal/GU negative Renal ROS     Musculoskeletal negative musculoskeletal ROS (+)   Abdominal   Peds  Hematology  (+) anemia ,  Plt 283k    Anesthesia Other Findings Covid test negative   Reproductive/Obstetrics (+) Pregnancy                            Anesthesia Physical Anesthesia Plan  ASA: 2  Anesthesia Plan: Spinal   Post-op Pain Management:    Induction:   PONV Risk Score and Plan: 2 and Treatment may vary due to age or medical condition, Ondansetron and Scopolamine patch - Pre-op  Airway Management Planned: Natural Airway  Additional Equipment: None  Intra-op Plan:   Post-operative Plan:   Informed Consent: I have reviewed the patients History and Physical, chart, labs and discussed the procedure including the risks, benefits and alternatives for the proposed anesthesia with the patient or authorized representative who has indicated his/her understanding and acceptance.       Plan Discussed with: CRNA and Anesthesiologist  Anesthesia Plan Comments: (Labs reviewed, platelets acceptable. Discussed risks and benefits of spinal, including spinal/epidural hematoma, infection, failed block, and PDPH. Patient expressed understanding and wished to proceed. )        Anesthesia Quick Evaluation

## 2021-06-28 ENCOUNTER — Encounter (HOSPITAL_COMMUNITY)
Admission: RE | Disposition: A | Discharge status: Home / Self Care | Payer: Self-pay | Source: Home / Self Care | Attending: Obstetrics and Gynecology

## 2021-06-28 ENCOUNTER — Inpatient Hospital Stay (HOSPITAL_COMMUNITY): Payer: 59 | Admitting: Anesthesiology

## 2021-06-28 ENCOUNTER — Other Ambulatory Visit: Payer: Self-pay

## 2021-06-28 ENCOUNTER — Encounter (HOSPITAL_COMMUNITY): Payer: Self-pay | Admitting: Obstetrics and Gynecology

## 2021-06-28 ENCOUNTER — Inpatient Hospital Stay (HOSPITAL_COMMUNITY)
Admission: RE | Admit: 2021-06-28 | Discharge: 2021-07-01 | DRG: 787 | Disposition: A | Discharge status: Home / Self Care | Payer: 59 | Attending: Obstetrics and Gynecology | Admitting: Obstetrics and Gynecology

## 2021-06-28 DIAGNOSIS — O9902 Anemia complicating childbirth: Secondary | ICD-10-CM | POA: Diagnosis present

## 2021-06-28 DIAGNOSIS — R310 Gross hematuria: Secondary | ICD-10-CM | POA: Diagnosis present

## 2021-06-28 DIAGNOSIS — A6000 Herpesviral infection of urogenital system, unspecified: Secondary | ICD-10-CM | POA: Diagnosis present

## 2021-06-28 DIAGNOSIS — Z87891 Personal history of nicotine dependence: Secondary | ICD-10-CM | POA: Diagnosis not present

## 2021-06-28 DIAGNOSIS — D509 Iron deficiency anemia, unspecified: Secondary | ICD-10-CM | POA: Diagnosis present

## 2021-06-28 DIAGNOSIS — O99892 Other specified diseases and conditions complicating childbirth: Secondary | ICD-10-CM | POA: Diagnosis present

## 2021-06-28 DIAGNOSIS — Z20822 Contact with and (suspected) exposure to covid-19: Secondary | ICD-10-CM | POA: Diagnosis present

## 2021-06-28 DIAGNOSIS — O321XX0 Maternal care for breech presentation, not applicable or unspecified: Principal | ICD-10-CM | POA: Diagnosis present

## 2021-06-28 DIAGNOSIS — O99824 Streptococcus B carrier state complicating childbirth: Secondary | ICD-10-CM | POA: Diagnosis present

## 2021-06-28 DIAGNOSIS — O9832 Other infections with a predominantly sexual mode of transmission complicating childbirth: Secondary | ICD-10-CM | POA: Diagnosis present

## 2021-06-28 DIAGNOSIS — O99214 Obesity complicating childbirth: Secondary | ICD-10-CM | POA: Diagnosis present

## 2021-06-28 DIAGNOSIS — Z3A39 39 weeks gestation of pregnancy: Secondary | ICD-10-CM | POA: Diagnosis not present

## 2021-06-28 LAB — URINALYSIS, ROUTINE W REFLEX MICROSCOPIC
Bilirubin Urine: NEGATIVE
Glucose, UA: NEGATIVE mg/dL
Ketones, ur: NEGATIVE mg/dL
Leukocytes,Ua: NEGATIVE
Nitrite: NEGATIVE
Protein, ur: NEGATIVE mg/dL
Specific Gravity, Urine: 1.01 (ref 1.005–1.030)
pH: 7.5 (ref 5.0–8.0)

## 2021-06-28 LAB — URINALYSIS, MICROSCOPIC (REFLEX)
Bacteria, UA: NONE SEEN
RBC / HPF: >50 RBC/hpf (ref 0–5)

## 2021-06-28 LAB — PREGNANCY, URINE: Preg Test, Ur: POSITIVE — AB

## 2021-06-28 SURGERY — Surgical Case
Anesthesia: Spinal

## 2021-06-28 MED ORDER — SIMETHICONE 80 MG PO CHEW
80.0000 mg | CHEWABLE_TABLET | Freq: Three times a day (TID) | ORAL | Status: DC
  Administered 2021-06-28 – 2021-07-01 (×8): 80 mg via ORAL
  Filled 2021-06-28 (×8): qty 1

## 2021-06-28 MED ORDER — SCOPOLAMINE 1 MG/3DAYS TD PT72
MEDICATED_PATCH | TRANSDERMAL | Status: AC
  Filled 2021-06-28: qty 1

## 2021-06-28 MED ORDER — CHLORHEXIDINE GLUCONATE 0.12 % MT SOLN
OROMUCOSAL | Status: AC
  Filled 2021-06-28: qty 15

## 2021-06-28 MED ORDER — PHENYLEPHRINE HCL-NACL 20-0.9 MG/250ML-% IV SOLN
INTRAVENOUS | Status: AC
  Filled 2021-06-28: qty 250

## 2021-06-28 MED ORDER — SENNOSIDES-DOCUSATE SODIUM 8.6-50 MG PO TABS
2.0000 | ORAL_TABLET | ORAL | Status: DC
  Administered 2021-06-28 – 2021-07-01 (×4): 2 via ORAL
  Filled 2021-06-28 (×4): qty 2

## 2021-06-28 MED ORDER — ONDANSETRON HCL 4 MG/2ML IJ SOLN
INTRAMUSCULAR | Status: DC | PRN
  Administered 2021-06-28: 4 mg via INTRAVENOUS

## 2021-06-28 MED ORDER — OXYCODONE HCL 5 MG PO TABS
5.0000 mg | ORAL_TABLET | Freq: Once | ORAL | Status: DC | PRN
Start: 2021-06-28 — End: 2021-06-28

## 2021-06-28 MED ORDER — MORPHINE SULFATE (PF) 0.5 MG/ML IJ SOLN
INTRAMUSCULAR | Status: AC
  Filled 2021-06-28: qty 10

## 2021-06-28 MED ORDER — OXYCODONE HCL 5 MG/5ML PO SOLN
5.0000 mg | Freq: Once | ORAL | Status: DC | PRN
Start: 2021-06-28 — End: 2021-06-28

## 2021-06-28 MED ORDER — FAMOTIDINE 20 MG PO TABS
ORAL_TABLET | ORAL | Status: AC
  Filled 2021-06-28: qty 1

## 2021-06-28 MED ORDER — ACETAMINOPHEN 500 MG PO TABS
1000.0000 mg | ORAL_TABLET | Freq: Four times a day (QID) | ORAL | Status: AC
  Administered 2021-06-28 – 2021-06-29 (×4): 1000 mg via ORAL
  Filled 2021-06-28 (×4): qty 2

## 2021-06-28 MED ORDER — LACTATED RINGERS IV SOLN: INTRAVENOUS | Status: DC

## 2021-06-28 MED ORDER — NALBUPHINE HCL 10 MG/ML IJ SOLN: 5.0000 mg | INTRAMUSCULAR | Status: DC | PRN

## 2021-06-28 MED ORDER — SOD CITRATE-CITRIC ACID 500-334 MG/5ML PO SOLN
30.0000 mL | Freq: Once | ORAL | Status: AC
  Administered 2021-06-28: 30 mL via ORAL

## 2021-06-28 MED ORDER — SOD CITRATE-CITRIC ACID 500-334 MG/5ML PO SOLN: 30.0000 mL | ORAL | Status: AC

## 2021-06-28 MED ORDER — BUPIVACAINE IN DEXTROSE 0.75-8.25 % IT SOLN
INTRATHECAL | Status: DC | PRN
  Administered 2021-06-28: 1.6 mL via INTRATHECAL

## 2021-06-28 MED ORDER — SCOPOLAMINE 1 MG/3DAYS TD PT72: 1.0000 | MEDICATED_PATCH | Freq: Once | TRANSDERMAL | Status: DC

## 2021-06-28 MED ORDER — DEXAMETHASONE SODIUM PHOSPHATE 4 MG/ML IJ SOLN
INTRAMUSCULAR | Status: AC
  Filled 2021-06-28: qty 1

## 2021-06-28 MED ORDER — CEFAZOLIN SODIUM-DEXTROSE 2-4 GM/100ML-% IV SOLN
2.0000 g | INTRAVENOUS | Status: AC
  Administered 2021-06-28: 2 g via INTRAVENOUS

## 2021-06-28 MED ORDER — SODIUM CHLORIDE 0.9% FLUSH: 3.0000 mL | INTRAVENOUS | Status: DC | PRN

## 2021-06-28 MED ORDER — LACTATED RINGERS IV SOLN: INTRAVENOUS | Status: DC | PRN

## 2021-06-28 MED ORDER — OXYTOCIN-SODIUM CHLORIDE 30-0.9 UT/500ML-% IV SOLN
2.5000 [IU]/h | INTRAVENOUS | Status: AC
  Administered 2021-06-28: 2.5 [IU]/h via INTRAVENOUS
  Filled 2021-06-28: qty 500

## 2021-06-28 MED ORDER — DIPHENHYDRAMINE HCL 25 MG PO CAPS
25.0000 mg | ORAL_CAPSULE | Freq: Four times a day (QID) | ORAL | Status: DC | PRN
  Administered 2021-06-29 (×2): 25 mg via ORAL
  Filled 2021-06-28: qty 1

## 2021-06-28 MED ORDER — PRENATAL MULTIVITAMIN CH
1.0000 | ORAL_TABLET | Freq: Every day | ORAL | Status: DC
  Administered 2021-06-29 – 2021-06-30 (×2): 1 via ORAL
  Filled 2021-06-28 (×2): qty 1

## 2021-06-28 MED ORDER — NALBUPHINE HCL 10 MG/ML IJ SOLN: 5.0000 mg | Freq: Once | INTRAMUSCULAR | Status: DC | PRN

## 2021-06-28 MED ORDER — CEFAZOLIN SODIUM-DEXTROSE 2-4 GM/100ML-% IV SOLN
INTRAVENOUS | Status: AC
  Filled 2021-06-28: qty 100

## 2021-06-28 MED ORDER — FAMOTIDINE 20 MG PO TABS
20.0000 mg | ORAL_TABLET | Freq: Once | ORAL | Status: AC
  Administered 2021-06-28: 20 mg via ORAL

## 2021-06-28 MED ORDER — FENTANYL CITRATE (PF) 100 MCG/2ML IJ SOLN
INTRAMUSCULAR | Status: DC | PRN
  Administered 2021-06-28: 15 ug via INTRATHECAL

## 2021-06-28 MED ORDER — ACETAMINOPHEN 500 MG PO TABS: 1000.0000 mg | ORAL_TABLET | Freq: Four times a day (QID) | ORAL | Status: DC

## 2021-06-28 MED ORDER — COCONUT OIL OIL: 1.0000 "application " | TOPICAL_OIL | Status: DC | PRN

## 2021-06-28 MED ORDER — DIPHENHYDRAMINE HCL 50 MG/ML IJ SOLN
12.5000 mg | INTRAMUSCULAR | Status: DC | PRN
  Administered 2021-06-28: 12.5 mg via INTRAVENOUS
  Filled 2021-06-28: qty 1

## 2021-06-28 MED ORDER — OXYTOCIN-SODIUM CHLORIDE 30-0.9 UT/500ML-% IV SOLN
INTRAVENOUS | Status: AC
  Filled 2021-06-28: qty 500

## 2021-06-28 MED ORDER — OXYTOCIN-SODIUM CHLORIDE 30-0.9 UT/500ML-% IV SOLN
INTRAVENOUS | Status: DC | PRN
  Administered 2021-06-28: 400 mL via INTRAVENOUS

## 2021-06-28 MED ORDER — NALOXONE HCL 4 MG/10ML IJ SOLN
1.0000 ug/kg/h | INTRAVENOUS | Status: DC | PRN
  Filled 2021-06-28: qty 5

## 2021-06-28 MED ORDER — DEXAMETHASONE SODIUM PHOSPHATE 4 MG/ML IJ SOLN
INTRAMUSCULAR | Status: DC | PRN
  Administered 2021-06-28: 4 mg via INTRAVENOUS

## 2021-06-28 MED ORDER — FENTANYL CITRATE (PF) 100 MCG/2ML IJ SOLN
25.0000 ug | INTRAMUSCULAR | Status: DC | PRN
  Administered 2021-06-28: 50 ug via INTRAVENOUS

## 2021-06-28 MED ORDER — DIBUCAINE (PERIANAL) 1 % EX OINT: 1.0000 "application " | TOPICAL_OINTMENT | CUTANEOUS | Status: DC | PRN

## 2021-06-28 MED ORDER — ONDANSETRON HCL 4 MG/2ML IJ SOLN
INTRAMUSCULAR | Status: AC
  Filled 2021-06-28: qty 2

## 2021-06-28 MED ORDER — SIMETHICONE 80 MG PO CHEW: 80.0000 mg | CHEWABLE_TABLET | ORAL | Status: DC | PRN

## 2021-06-28 MED ORDER — MORPHINE SULFATE (PF) 0.5 MG/ML IJ SOLN
INTRAMUSCULAR | Status: DC | PRN
  Administered 2021-06-28: .15 mg via INTRATHECAL

## 2021-06-28 MED ORDER — SCOPOLAMINE 1 MG/3DAYS TD PT72
1.0000 | MEDICATED_PATCH | Freq: Once | TRANSDERMAL | Status: DC
  Administered 2021-06-28: 1.5 mg via TRANSDERMAL

## 2021-06-28 MED ORDER — MENTHOL 3 MG MT LOZG: 1.0000 | LOZENGE | OROMUCOSAL | Status: DC | PRN

## 2021-06-28 MED ORDER — PROMETHAZINE HCL 25 MG/ML IJ SOLN: 6.2500 mg | INTRAMUSCULAR | Status: DC | PRN

## 2021-06-28 MED ORDER — ZOLPIDEM TARTRATE 5 MG PO TABS: 5.0000 mg | ORAL_TABLET | Freq: Every evening | ORAL | Status: DC | PRN

## 2021-06-28 MED ORDER — ORAL CARE MOUTH RINSE: 15.0000 mL | Freq: Once | OROMUCOSAL | Status: AC

## 2021-06-28 MED ORDER — BUPIVACAINE HCL (PF) 0.25 % IJ SOLN
INTRAMUSCULAR | Status: AC
  Filled 2021-06-28: qty 30

## 2021-06-28 MED ORDER — PHENYLEPHRINE HCL-NACL 20-0.9 MG/250ML-% IV SOLN
INTRAVENOUS | Status: DC | PRN
  Administered 2021-06-28: 60 ug/min via INTRAVENOUS

## 2021-06-28 MED ORDER — WITCH HAZEL-GLYCERIN EX PADS: 1.0000 "application " | MEDICATED_PAD | CUTANEOUS | Status: DC | PRN

## 2021-06-28 MED ORDER — VALACYCLOVIR HCL 500 MG PO TABS
500.0000 mg | ORAL_TABLET | Freq: Every day | ORAL | Status: DC
  Administered 2021-06-29 – 2021-07-01 (×3): 500 mg via ORAL
  Filled 2021-06-28 (×3): qty 1

## 2021-06-28 MED ORDER — DIPHENHYDRAMINE HCL 25 MG PO CAPS
25.0000 mg | ORAL_CAPSULE | ORAL | Status: DC | PRN
  Filled 2021-06-28: qty 1

## 2021-06-28 MED ORDER — NALOXONE HCL 0.4 MG/ML IJ SOLN: 0.4000 mg | INTRAMUSCULAR | Status: DC | PRN

## 2021-06-28 MED ORDER — BUPIVACAINE HCL (PF) 0.25 % IJ SOLN
INTRAMUSCULAR | Status: DC | PRN
  Administered 2021-06-28: 10 mL

## 2021-06-28 MED ORDER — CHLORHEXIDINE GLUCONATE 0.12 % MT SOLN
15.0000 mL | Freq: Once | OROMUCOSAL | Status: AC
  Administered 2021-06-28: 15 mL via OROMUCOSAL

## 2021-06-28 MED ORDER — FENTANYL CITRATE (PF) 100 MCG/2ML IJ SOLN
INTRAMUSCULAR | Status: AC
  Filled 2021-06-28: qty 2

## 2021-06-28 MED ORDER — OXYCODONE HCL 5 MG PO TABS
5.0000 mg | ORAL_TABLET | ORAL | Status: DC | PRN
  Administered 2021-06-29 – 2021-06-30 (×9): 10 mg via ORAL
  Filled 2021-06-28 (×9): qty 2

## 2021-06-28 MED ORDER — SOD CITRATE-CITRIC ACID 500-334 MG/5ML PO SOLN
ORAL | Status: AC
  Filled 2021-06-28: qty 30

## 2021-06-28 MED ORDER — ONDANSETRON HCL 4 MG/2ML IJ SOLN: 4.0000 mg | Freq: Three times a day (TID) | INTRAMUSCULAR | Status: DC | PRN

## 2021-06-28 MED ORDER — POVIDONE-IODINE 10 % EX SWAB
2.0000 "application " | Freq: Once | CUTANEOUS | Status: AC
  Administered 2021-06-28: 2 via TOPICAL

## 2021-06-28 SURGICAL SUPPLY — 44 items
APL SKNCLS STERI-STRIP NONHPOA (GAUZE/BANDAGES/DRESSINGS) ×1
BARRIER ADHS 3X4 INTERCEED (GAUZE/BANDAGES/DRESSINGS) ×2 IMPLANT
BENZOIN TINCTURE PRP APPL 2/3 (GAUZE/BANDAGES/DRESSINGS) ×1 IMPLANT
BRR ADH 4X3 ABS CNTRL BYND (GAUZE/BANDAGES/DRESSINGS) ×1
CHLORAPREP W/TINT 26ML (MISCELLANEOUS) ×3 IMPLANT
CLAMP CORD UMBIL (MISCELLANEOUS) IMPLANT
CLOTH BEACON ORANGE TIMEOUT ST (SAFETY) ×2 IMPLANT
DRAPE C SECTION CLR SCREEN (DRAPES) ×2 IMPLANT
DRSG OPSITE POSTOP 4X10 (GAUZE/BANDAGES/DRESSINGS) ×2 IMPLANT
ELECT REM PT RETURN 9FT ADLT (ELECTROSURGICAL) ×2
ELECTRODE REM PT RTRN 9FT ADLT (ELECTROSURGICAL) ×1 IMPLANT
EXTRACTOR VACUUM M CUP 4 TUBE (SUCTIONS) IMPLANT
GLOVE BIOGEL PI IND STRL 7.0 (GLOVE) ×2 IMPLANT
GLOVE BIOGEL PI INDICATOR 7.0 (GLOVE) ×2
GLOVE ECLIPSE 6.5 STRL STRAW (GLOVE) ×2 IMPLANT
GOWN STRL REUS W/TWL LRG LVL3 (GOWN DISPOSABLE) ×4 IMPLANT
KIT ABG SYR 3ML LUER SLIP (SYRINGE) IMPLANT
NDL HYPO 25X5/8 SAFETYGLIDE (NEEDLE) IMPLANT
NEEDLE HYPO 22GX1.5 SAFETY (NEEDLE) ×2 IMPLANT
NEEDLE HYPO 25X5/8 SAFETYGLIDE (NEEDLE) IMPLANT
NS IRRIG 1000ML POUR BTL (IV SOLUTION) ×2 IMPLANT
PACK C SECTION WH (CUSTOM PROCEDURE TRAY) ×2 IMPLANT
PAD OB MATERNITY 4.3X12.25 (PERSONAL CARE ITEMS) ×2 IMPLANT
RTRCTR C-SECT PINK 25CM LRG (MISCELLANEOUS) IMPLANT
STRIP CLOSURE SKIN 1/2X4 (GAUZE/BANDAGES/DRESSINGS) ×1 IMPLANT
SUT CHROMIC GUT AB #0 18 (SUTURE) IMPLANT
SUT MNCRL 0 VIOLET CTX 36 (SUTURE) ×3 IMPLANT
SUT MON AB 2-0 SH 27 (SUTURE)
SUT MON AB 2-0 SH27 (SUTURE) IMPLANT
SUT MON AB 3-0 SH 27 (SUTURE)
SUT MON AB 3-0 SH27 (SUTURE) IMPLANT
SUT MON AB 4-0 PS1 27 (SUTURE) IMPLANT
SUT MONOCRYL 0 CTX 36 (SUTURE) ×6
SUT PLAIN 2 0 (SUTURE)
SUT PLAIN 2 0 XLH (SUTURE) IMPLANT
SUT PLAIN ABS 2-0 CT1 27XMFL (SUTURE) IMPLANT
SUT VIC AB 0 CT1 36 (SUTURE) ×4 IMPLANT
SUT VIC AB 2-0 CT1 27 (SUTURE) ×2
SUT VIC AB 2-0 CT1 TAPERPNT 27 (SUTURE) ×1 IMPLANT
SUT VIC AB 4-0 PS2 27 (SUTURE) IMPLANT
SYR CONTROL 10ML LL (SYRINGE) ×2 IMPLANT
TOWEL OR 17X24 6PK STRL BLUE (TOWEL DISPOSABLE) ×2 IMPLANT
TRAY FOLEY W/BAG SLVR 14FR LF (SET/KITS/TRAYS/PACK) IMPLANT
WATER STERILE IRR 1000ML POUR (IV SOLUTION) ×2 IMPLANT

## 2021-06-28 NOTE — Lactation Note (Signed)
This note was copied from a baby's chart. Lactation Consultation Note  Patient Name: Deborah Weber [id number] Date: 06/28/2021   Age:25 hours  [place] visit attempted, pediatrician in room. LC to return.    Matthias Hughs [facility] 06/28/2021, 3:23 PM

## 2021-06-28 NOTE — Op Note (Signed)
NAME: Deborah Weber, Deborah Weber MEDICAL RECORD NO: [id number] ACCOUNT NO: [id number] DATE OF BIRTH: 1996-09-11 FACILITY: [facility] LOCATION: [id number] PHYSICIAN: Aarish Rockers A. Garwin Brothers, MD  Operative Report   DATE OF PROCEDURE: 06/28/2021  PREOPERATIVE DIAGNOSIS:  Breech presentation, term gestation, gross hematuria group B strep positive.  PROCEDURES:  Primary cesarean section, Kerr hysterotomy.  POSTOPERATIVE DIAGNOSES:  Complete breech presentation, term gestation, gross hematuria, group B strep positive.  ANESTHESIA:  Spinal.  SURGEON:  Maxen Rowland A. Garwin Brothers, MD  ASSISTANT:  None.  DESCRIPTION OF PROCEDURE:  Under adequate spinal anesthesia the patient was placed in the supine position with a left lateral tilt.  She was sterilely prepped and draped in the usual fashion.  An indwelling Foley catheter was sterilely placed.  It was  then noted that the patient had blood in the urine.  Urine specimen was obtained for urinalysis and urine culture.  The patient was sterilely prepped and draped in the usual fashion.  A 0.25% Marcaine was injected along the planned Pfannenstiel skin  incision site.  Pfannenstiel skin incision was then made, carried down to the rectus fascia.  The rectus fascia was opened transversely.  Rectus fascia was bluntly and sharply dissected off the rectus muscle in the superior and inferior fashion.  The  rectus muscle was split in the midline.  The parietal peritoneum was entered bluntly and extended.  A self-retaining Alexis retractor was then placed.  Vesicouterine peritoneum was opened transversely.  The bladder was displaced inferiorly with sharp  dissection.  A small curvilinear low transverse incision was made and extended in a cephalic and caudad manner using blunt dissection.  Through the amniotic sac the feet were identified and appeared to be in a complete breech presentation. Artificial rupture of membrane then occurred.  The right  foot was delivered followed by the  left and the body was then brought down with rotation, the left arm was delivered followed by the right arm and at that point, a cord around the neck  was noted, which was easily reduced and the baby was  delivered.  Baby was bulb suctioned on the abdomen.  Delayed cord clamping was done for 1 minute.  The cord was then clamped and cut.  The baby was transferred to the waiting pediatricians who assigned Apgars of 9 and 9 at 1 and 5 minutes respectively.  Placenta was  spontaneous intact, not sent to pathology.  The uterus was initially boggy, then uterine massage was done.  Uterine cavity was cleaned of debris.  No intrauterine anomaly was noted and no external abnormality of the uterus on exteriorization of the uterus was  noted.  Normal tubes and ovaries was noted bilaterally.  The uterine incision had no extension.  It was closed in two layers, the first layer with 0 Monocryl in a running locked stitch.  Second layer was imbricated using 0 Monocryl suture.  The abdomen  was irrigated and suctioned of debris.  Good hemostasis was noted.  Small bleeding in the lower aspect of the incision was cauterized.  The uterus was returned back to the abdomen.  Interceed was placed in the inverted T fashion overlying the incision.   The Alexis retractor was removed.  The parietal peritoneum was closed with 2-0 Vicryl.  The rectus fascia was closed with 0 Vicryl x2.  The subcutaneous area was irrigated.  Small bleeders cauterized.  Interrupted 2-0 plain sutures placed in the skin  approximated using 4-0 Monocryl subcuticular closure.  Steri-Strips and benzoin was placed.  SPECIMEN:  Placenta, not sent to Pathology.  ESTIMATED BLOOD LOSS:  379 mL.  URINE OUTPUT:  150 mL blood-tinged urine.  INTRAOPERATIVE FLUIDS:  2100 mL.  Sponge and instrument counts x2 was correct.  Complication was none.  The patient tolerated the procedure well and was transferred to recovery room in stable condition. Baby was placed skin  to skin.   PUS D: 06/28/2021 12:59:00 pm T: 06/28/2021 2:39:00 pm  JOB: [id number]

## 2021-06-28 NOTE — Addendum Note (Signed)
Addendum  created 06/28/21 1818 by Audry Pili, MD   Child order released for a procedure order, Clinical Note Signed, Intraprocedure Blocks edited, Intraprocedure Meds edited, SmartForm saved

## 2021-06-28 NOTE — Interval H&P Note (Signed)
History and Physical Interval Note:  06/28/2021 10:56 AM  Deborah Weber RENIA GATTO  has presented today for surgery, with the diagnosis of breech primary section.  The various methods of treatment have been discussed with the patient and family. After consideration of risks, benefits and other options for treatment, the patient has consented to  [place] as a surgical intervention.  The patient's history has been reviewed, patient examined, no change in status, stable for surgery.  I have reviewed the patient's chart and labs.  Questions were answered to the patient's satisfaction.     Valerye Kobus A Graeden Bitner

## 2021-06-28 NOTE — Anesthesia Procedure Notes (Signed)
Spinal  Patient location during procedure: OR Start time: 06/28/2021 11:36 AM End time: 06/28/2021 11:40 AM Reason for block: surgical anesthesia Staffing Performed: anesthesiologist  Anesthesiologist: Audry Pili, MD Preanesthetic Checklist Completed: patient identified, IV checked, risks and benefits discussed, surgical consent, monitors and equipment checked, pre-op evaluation and timeout performed Spinal Block Patient position: sitting Prep: DuraPrep Patient monitoring: heart rate, cardiac monitor, continuous pulse ox and blood pressure Approach: midline Location: L3-4 Injection technique: single-shot Needle Needle type: Pencan  Needle gauge: 24 G Additional Notes Consent was obtained prior to the procedure with all questions answered and concerns addressed. Risks including, but not limited to, bleeding, infection, nerve damage, paralysis, failed block, inadequate analgesia, allergic reaction, high spinal, itching, and headache were discussed and the patient wished to proceed. Functioning IV was confirmed and monitors were applied. Sterile prep and drape, including hand hygiene, mask, and sterile gloves were used. The patient was positioned and the spine was prepped. The skin was anesthetized with lidocaine. Free flow of clear CSF was obtained prior to injecting local anesthetic into the CSF. The spinal needle aspirated freely following injection. The needle was carefully withdrawn. The patient tolerated the procedure well.   Renold Don, MD

## 2021-06-28 NOTE — Transfer of Care (Signed)
Immediate Anesthesia Transfer of Care Note  Patient: Deborah Weber  Procedure(s) Performed: CESAREAN SECTION  Patient Location: PACU  Anesthesia Type:Spinal  Level of Consciousness: awake, alert  and patient cooperative  Airway & Oxygen Therapy: Patient Spontanous Breathing  Post-op Assessment: Report given to RN and Post -op Vital signs reviewed and stable  Post vital signs: Reviewed and stable  Last Vitals:  Vitals Value Taken Time  BP 118/65 06/28/21 1305  Temp 36.3 C 06/28/21 1305  Pulse 87 06/28/21 1311  Resp 18 06/28/21 1311  SpO2 100 % 06/28/21 1311  Vitals shown include unvalidated device data.  Last Pain:  Vitals:   06/28/21 1305  TempSrc: Oral  PainSc:          Complications: No notable events documented.

## 2021-06-28 NOTE — H&P (Addendum)
Deborah Weber is a 25 y.o. female presenting @ 39 1/7 weeks for Primary C/S due to breech presentation. Pt declines ECV. Sono done 8/17 showed Breech presentation, EFW 6lb 2 oz(16%) AC( 3%), borderline low fluid. GBS cx (+). Hx HSV on Valtrex suppression w/o prodromal or active lesion. Evaluated in the office for possible leakage with neg fern and nitrazine test . 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 Social History:  reports that she quit smoking about 7 months ago. Her smoking use included cigarettes. She has never used smokeless tobacco. She reports that she does not currently use alcohol. She reports that she does not use drugs.     Maternal Diabetes: No Genetic Screening: Normal Maternal Ultrasounds/Referrals: Normal Fetal Ultrasounds or other Referrals:  None Maternal Substance Abuse:  No Significant Maternal Medications:  Meds include: Other:  Valtrex Significant Maternal Lab Results:  Group B Strep positive Other Comments:   hx HSV , hgb C trait  Review of Systems  All other systems reviewed and are negative. History   Last menstrual period 10/03/2020. Maternal Exam:  Introitus:  Normal vulva.  Physical Exam Constitutional:      Appearance: She is obese.  HENT:     Head: Atraumatic.  Eyes:     Extraocular Movements: Extraocular movements intact.  Cardiovascular:     Rate and Rhythm: Regular rhythm.  Pulmonary:     Breath sounds: Normal breath sounds.  Abdominal:     Palpations: Abdomen is soft.     Comments: gravid  Genitourinary:    General: Normal vulva.  Musculoskeletal:        General: Normal range of motion.     Cervical back: Neck supple.  Skin:    General: Skin is warm and dry.  Neurological:     General: No focal deficit present.     Mental Status: She is alert.  Psychiatric:        Mood and Affect: Mood normal.        Behavior: Behavior normal.    Prenatal labs: ABO, Rh: --/--/O POS (08/25 1009) Antibody: NEG (08/25 1009) Rubella:  Immune RPR: NON REACTIVE (08/25 1009)  HBsAg:   negative HIV:   neg GBS:   positive Hep C neg Assessment/Plan: Breech presentation Term gestation  GBS cx positive Hx HSV on Valtrex suppression  P) Primary C/S. Procedure explained. Risk of surgery reviewed Including infection, bleeding, injury to bladder, bowel, ureters, internal scar tissue, possible need for rpt C/S, poss blood transfusion and its risk( HIV, acute rxn, fever/rash). All ? answered   Candise Crabtree A Iniko Robles 06/28/2021, 8:59 AM

## 2021-06-28 NOTE — Anesthesia Postprocedure Evaluation (Signed)
Anesthesia Post Note  Patient: Deborah Weber  Procedure(s) Performed: CESAREAN SECTION     Patient location during evaluation: PACU Anesthesia Type: Spinal Level of consciousness: awake and alert Pain management: pain level controlled Vital Signs Assessment: post-procedure vital signs reviewed and stable Respiratory status: spontaneous breathing and respiratory function stable Cardiovascular status: blood pressure returned to baseline and stable Postop Assessment: spinal receding and no apparent nausea or vomiting Anesthetic complications: no   No notable events documented.  Last Vitals:  Vitals:   06/28/21 1355 06/28/21 1400  BP:  121/78  Pulse: 75 66  Resp: 12 12  Temp:  (!) 36.4 C  SpO2: 98% 98%    Last Pain:  Vitals:   06/28/21 1400  TempSrc: Oral  PainSc: 5    Pain Goal:      LLE Sensation: Tingling (06/28/21 1400)   RLE Sensation: Tingling (06/28/21 1400) L Sensory Level: L1-Inguinal (groin) region (06/28/21 1400) R Sensory Level: L1-Inguinal (groin) region (06/28/21 1400) Epidural/Spinal Function Cutaneous sensation: Able to Wiggle Toes (06/28/21 1400), Patient able to flex knees: No (06/28/21 1400), Patient able to lift hips off bed: No (06/28/21 1400), Back pain beyond tenderness at insertion site: No (06/28/21 1400), Progressively worsening motor and/or sensory loss: No (06/28/21 1400), Bowel and/or bladder incontinence post epidural: No (06/28/21 1400)  Audry Pili

## 2021-06-28 NOTE — Lactation Note (Addendum)
This note was copied from a baby's chart. Lactation Consultation Note  Patient Name: Deborah Weber [id number] Date: 06/28/2021 Reason for consult: Initial assessment;Primapara;Term Age:25 hours  Mom is a P1 and a C/S for breech. Per Mom, she had been told that her nipples may be too large for infant's mouth. Mom's nipples are a normal diameter (she would likely need a size 24 flange on R breast/size 21/24 on L breast, if pumping). RN said she had taken in a nipple shield, but it is possible that the teacup hold will be adequate for latching.  Infant is currently sleeping, but parents & MGM know they can call for me to return when infant is cueing or if they have any questions.   Deborah Weber [facility] 06/28/2021, 4:01 PM

## 2021-06-28 NOTE — Interval H&P Note (Signed)
History and Physical Interval Note:  06/28/2021 10:58 AM  Deborah Weber  has presented today for surgery, with the diagnosis of breech primary section.  The various methods of treatment have been discussed with the patient and family. After consideration of risks, benefits and other options for treatment, the patient has consented to  Procedure(s): CESAREAN SECTION (N/A) as a surgical intervention.  The patient's history has been reviewed, patient examined, no change in status, stable for surgery.  I have reviewed the patient's chart and labs.  Questions were answered to the patient's satisfaction.     Deborah Weber [name]  ADDENDUM' BEDSIDE sono: breech maternal left confirmed

## 2021-06-28 NOTE — Lactation Note (Signed)
This note was copied from a baby's chart. Lactation Consultation Note  Patient Name: Deborah Weber [id number] Date: 06/28/2021  Mom called for me to return b/c infant was cueing. Attempt at latching was attempted, but infant fell asleep. Hand expression was taught to Mom and the resulting colostrum was given to "[name]" on a clean gloved finger.  Additional colostrum was expressed and (2-3 mL) put to bedside. Parents were instructed how to give to infant if needed to entice/offer to [facility] if no latch within the next few hours.   Mom made aware of O/P services, breastfeeding support groups, community resources ([employer]) and our phone # for post-discharge questions.    Matthias Hughs [facility] 06/28/2021, 4:37 PM

## 2021-06-28 NOTE — Brief Op Note (Signed)
06/28/2021  12:59 PM  PATIENT:  Deborah Weber  25 y.o. female  PRE-OPERATIVE DIAGNOSIS:  breech presentation, term gestation,gross hematuria, GBS cx positive  POST-OPERATIVE DIAGNOSIS:   Complete breech , term gestation, gross hematuria , GBS cx positive   PROCEDURE:  Primary Cesarean section, kerr hysterotomy  SURGEON:  Surgeon(s) and Role:    * Servando Salina, MD - Primary  PHYSICIAN ASSISTANT:   ASSISTANTS: none   ANESTHESIA:   spinal FINDINGS: LIVE FEMALE COMPLETE BREECH, NL TUBES AND OVARIES, CAN X 1, ANT PLACENTA , NO INTRACAVITARY ABNORMALITY.  LIGHT GROSS HEMATURIA EBL:  379 ml  BLOOD ADMINISTERED:none  DRAINS: none   LOCAL MEDICATIONS USED:  MARCAINE     SPECIMEN:  Source of Specimen:  placenta  DISPOSITION OF SPECIMEN:  N/A  COUNTS:  YES  TOURNIQUET:  * No tourniquets in log *  DICTATION: .Other Dictation: Dictation Number [id number]  PLAN OF CARE: Admit to inpatient   PATIENT DISPOSITION:  PACU - hemodynamically stable.   Delay start of Pharmacological VTE agent (>24hrs) due to surgical blood loss or risk of bleeding: no

## 2021-06-29 LAB — CBC
HCT: 28 % — ABNORMAL LOW (ref 36.0–46.0)
Hemoglobin: 9.5 g/dL — ABNORMAL LOW (ref 12.0–15.0)
MCH: 24.6 pg — ABNORMAL LOW (ref 26.0–34.0)
MCHC: 33.9 g/dL (ref 30.0–36.0)
MCV: 72.5 fL — ABNORMAL LOW (ref 80.0–100.0)
Platelets: 224 K/uL (ref 150–400)
RBC: 3.86 MIL/uL — ABNORMAL LOW (ref 3.87–5.11)
RDW: 15.2 % (ref 11.5–15.5)
WBC: 15.8 K/uL — ABNORMAL HIGH (ref 4.0–10.5)
nRBC: 0 % (ref 0.0–0.2)

## 2021-06-29 LAB — URINE CULTURE
Culture: NO GROWTH
Special Requests: NORMAL

## 2021-06-29 MED ORDER — ACETAMINOPHEN 500 MG PO TABS
1000.0000 mg | ORAL_TABLET | Freq: Four times a day (QID) | ORAL | Status: AC
Start: 2021-06-29 — End: 2021-06-30
  Administered 2021-06-29 – 2021-06-30 (×4): 1000 mg via ORAL
  Filled 2021-06-29 (×5): qty 2

## 2021-06-29 NOTE — Progress Notes (Signed)
SVD: primary  S:  Pt reports feeling well/ Tolerating po/ Voiding without problems/ No n/v/ Bleeding is light/ Pain controlled withprescription NSAID's including ibuprofen (Motrin) and narcotic analgesics including oxycodone/acetaminophen (Percocet, Tylox)    O:  A & O x 3 / VS: Blood pressure 115/79, pulse 69, temperature 99.4 F (37.4 C), temperature source Oral, resp. rate 17, height '5\' 9"'$  (1.753 m), weight 93 kg, last menstrual period 10/03/2020, SpO2 99 %, unknown if currently breastfeeding.  LABS:  Results for orders placed or performed during the hospital encounter of 06/28/21 (from the past 24 hour(s))  Pregnancy, urine     Status: Abnormal   Collection Time: 06/28/21 11:56 AM  Result Value Ref Range   Preg Test, Ur POSITIVE (A) NEGATIVE  Urinalysis, Routine w reflex microscopic Urine, Catheterized     Status: Abnormal   Collection Time: 06/28/21 11:56 AM  Result Value Ref Range   Color, Urine RED (A) YELLOW   APPearance CLOUDY (A) CLEAR   Specific Gravity, Urine 1.010 1.005 - 1.030   pH 7.5 5.0 - 8.0   Glucose, UA NEGATIVE NEGATIVE mg/dL   Hgb urine dipstick LARGE (A) NEGATIVE   Bilirubin Urine NEGATIVE NEGATIVE   Ketones, ur NEGATIVE NEGATIVE mg/dL   Protein, ur NEGATIVE NEGATIVE mg/dL   Nitrite NEGATIVE NEGATIVE   Leukocytes,Ua NEGATIVE NEGATIVE  Urinalysis, Microscopic (reflex)     Status: None   Collection Time: 06/28/21 11:56 AM  Result Value Ref Range   RBC / HPF >50 0 - 5 RBC/hpf   WBC, UA 0-5 0 - 5 WBC/hpf   Bacteria, UA NONE SEEN NONE SEEN   Squamous Epithelial / LPF 0-5 0 - 5  CBC     Status: Abnormal   Collection Time: 06/29/21  4:49 AM  Result Value Ref Range   WBC 15.8 (H) 4.0 - 10.5 K/uL   RBC 3.86 (L) 3.87 - 5.11 MIL/uL   Hemoglobin 9.5 (L) 12.0 - 15.0 g/dL   HCT 28.0 (L) 36.0 - 46.0 %   MCV 72.5 (L) 80.0 - 100.0 fL   MCH 24.6 (L) 26.0 - 34.0 pg   MCHC 33.9 30.0 - 36.0 g/dL   RDW 15.2 11.5 - 15.5 %   Platelets 224 150 - 400 K/uL   nRBC 0.0 0.0 -  0.2 %    I&O: I/O last 3 completed shifts: In: 1700 [I.V.:1700] Out: 2379 [Urine:1975; Blood:404]   Total I/O In: -  Out: 100 [Urine:100]  Lungs: chest clear, no wheezing, rales, normal symmetric air entry  Heart: regular rate and rhythm, S1, S2 normal, no murmur, click, rub or gallop  Abdomen: soft uterus 1 FB above umb on left . Surgical tender  Perineum: not inspected  Lochia: light  Extremities:no redness or tenderness in the calves or thighs, edema tr+    A/P: POD # 1/PPD # [id number] s/p Primary C/S IDA hematuria  Doing well  Continue routine post partum orders Cont iron supplement Await ucx

## 2021-06-29 NOTE — Lactation Note (Addendum)
This note was copied from a baby's chart. Lactation Consultation Note  Patient Name: Deborah Weber [id number] Date: 06/29/2021 Reason for consult: Follow-up assessment;Difficult latch Age:25 hours  P1, Per RN baby has not been latching. Mother's has areola edema on R breast.   Demonstrated reverse pressure softening and suck training. Mother hand expressed drops and LC assisted with latching in football hold.  Noted sucking and swallowing for 10 min and baby became sleepy. Baby is being supplemented with formula using slow flow bottle.  LC will return to set up DEBP.  Set up DEBP.  Reviewed use.  24 flange on L and 27 flange on R side.   Maternal Data Has patient been taught Hand Expression?: Yes Does the patient have breastfeeding experience prior to this delivery?: No  Feeding Mother's Current Feeding Choice: Breast Milk and Formula Nipple Type: Extra Slow Flow  LATCH Score Latch: Grasps breast easily, tongue down, lips flanged, rhythmical sucking.  Audible Swallowing: A few with stimulation  Type of Nipple: Everted at rest and after stimulation (R areola edema)  Comfort (Breast/Nipple): Soft / non-tender  Hold (Positioning): Assistance needed to correctly position infant at breast and maintain latch.  LATCH Score: 8   Interventions Interventions: Breast feeding basics reviewed;Assisted with latch;Skin to skin;Hand express;Education;Adjust position;Breast compression   Consult Status Consult Status: Follow-up Date: 06/29/21 Follow-up type: In-patient    Vivianne Master [facility] 06/29/2021, 1:22 PM

## 2021-06-30 MED ORDER — HYDROMORPHONE HCL 2 MG PO TABS
4.0000 mg | ORAL_TABLET | ORAL | Status: DC | PRN
  Administered 2021-06-30 – 2021-07-01 (×5): 4 mg via ORAL
  Filled 2021-06-30 (×5): qty 2

## 2021-06-30 NOTE — Progress Notes (Signed)
SVD: primary  S:  Pt reports feeling ok.  Baby feeding better. C/o right nipple/areola swollen c/o pain not controlled with medication/ Tolerating po/ Voiding without problems/ No n/v/ Bleeding is light/ Pain controlled withnarcotic analgesics including oxycodone/acetaminophen (Percocet, Tylox)    O:  A & O x 3 / VS: Blood pressure 124/87, pulse 86, temperature 98.5 F (36.9 C), temperature source Oral, resp. rate 17, height '5\' 9"'$  (1.753 m), weight 93 kg, last menstrual period 10/03/2020, SpO2 100 %, unknown if currently breastfeeding.  LABS: No results found for this or any previous visit (from the past 24 hour(s)).  I&O: I/O last 3 completed shifts: In: -  Out: 1600 [Urine:1600]   No intake/output data recorded.  Lungs: chest clear, no wheezing, rales, normal symmetric air entry  Heart: regular rate and rhythm, S1, S2 normal, no murmur, click, rub or gallop  Abdomen: soft uterus firm @ umb  Perineum: not inspected  Lochia: light   Extremities:no redness or tenderness in the calves or thighs, no edema  Breast . Right areola. Nipple appears larger with surrouding area  A/P: POD # 2/PPD # 2/ [id number] s/p Primary C/S  Doing well  Continue routine post partum orders  Will try dilaudid for pain mgmt  Anticipate d/c home

## 2021-07-01 MED ORDER — HYDROMORPHONE HCL 4 MG PO TABS: 4.0000 mg | ORAL_TABLET | ORAL | 0 refills | Status: AC | PRN

## 2021-07-01 NOTE — Discharge Instructions (Signed)
Call if temperature greater than equal to 100.4, nothing per vagina for 4-6 weeks or severe nausea vomiting, increased incisional pain , drainage or redness in the incision site, no straining with bowel movements, showers no bathCall if temperature greater than equal to 100.4, nothing per vagina for 4-6 weeks or severe nausea vomiting, increased incisional pain , drainage or redness in the incision site, no straining with bowel movements, showers no bath 

## 2021-07-01 NOTE — Progress Notes (Signed)
SVD: primary  S:  Pt reports feeling  much better with Dilaudid for pain ./ Tolerating po/ Voiding without problems/ No n/v/ Bleeding is light/ Pain controlled withnarcotic analgesics including hydromorphone (Dilaudid)    O:  A & O x 3 / VS: Blood pressure 123/78, pulse 95, temperature 98.1 F (36.7 C), temperature source Oral, resp. rate 18, height '5\' 9"'$  (1.753 m), weight 93 kg, last menstrual period 10/03/2020, SpO2 100 %, unknown if currently breastfeeding.  LABS: No results found for this or any previous visit (from the past 24 hour(s)).  I&O: No intake/output data recorded.   No intake/output data recorded.  Lungs: chest clear, no wheezing, rales, normal symmetric air entry  Heart: regular rate and rhythm, S1, S2 normal, no murmur, click, rub or gallop  Abdomen: soft uterus firm @ umb Primary dressing d/c/i  Perineum: not inspected  Lochia: light  Extremities:no redness or tenderness in the calves or thighs, no edema    A/P: POD # 3/PPD # 3/ [id number] S/p primary C/S  Pain now better controlled with dilaudid  Doing well  Continue routine post partum orders  D/c honeycomb dressing in 2 days  D/c instructions reviewed Script for dilaudid sent to pharm  Pt unable to take Motrin

## 2021-07-01 NOTE — Discharge Summary (Signed)
Postpartum Discharge Summary  Date of Service updated     Patient Name: Deborah Weber DOB: [birth year] MRN: [id number]  Date of admission: 06/28/2021 Delivery date:06/28/2021  Delivering provider: Maisie Hauser  Date of discharge: 07/01/2021  Admitting diagnosis: breech presentation Intrauterine pregnancy: [redacted]w[redacted]d    Secondary diagnosis:  Active Problems:   Postpartum care following cesarean delivery   Breech presentation, single or unspecified fetus  Additional problems: GBS cx positive    Discharge diagnosis: Term Pregnancy Delivered and anemia                                               Post partum procedures: none Augmentation: N/A Complications: None  Hospital course: Sceduled C/S   25y.o. yo G3P1021 at 375w1das admitted to the hospital 06/28/2021 for scheduled cesarean section with the following indication:Malpresentation and Elective Primary.Delivery details are as follows:  Membrane Rupture Time/Date: 12:05 PM ,06/28/2021   Delivery Method:C-Section, Low Transverse  Details of operation can be found in separate operative note.  Patient had an uncomplicated postpartum course.  She is ambulating, tolerating a regular diet, passing flatus, and urinating well. Patient is discharged home in stable condition on  07/01/21        Newborn Data: Birth date:06/28/2021  Birth time:12:06 PM  Gender:Female  Living status:Living  Apgars:9 ,9  Weight:2.915 kg     Magnesium Sulfate received: No BMZ received: No Rhophylac:No MMR:No T-DaP:Given prenatally Flu: No Transfusion:No  Physical exam  Vitals:   06/29/21 2246 06/30/21 1325 06/30/21 2000 07/01/21 0548  BP: 124/87 126/78 128/84 123/78  Pulse: 86 77 87 95  Resp: 17 18 18 18   Temp: 98.5 F (36.9 C) 98.1 F (36.7 C) 98.1 F (36.7 C) 98.1 F (36.7 C)  TempSrc: Oral Oral Oral Oral  SpO2: 100%  100% 100%  Weight:      Height:       General: alert, cooperative, and no distress Lochia: appropriate Uterine  Fundus: firm Incision: Dressing is clean, dry, and intact DVT Evaluation: No evidence of DVT seen on physical exam. No significant calf/ankle edema. Labs: Lab Results  Component Value Date   WBC 15.8 (H) 06/29/2021   HGB 9.5 (L) 06/29/2021   HCT 28.0 (L) 06/29/2021   MCV 72.5 (L) 06/29/2021   PLT 224 06/29/2021   CMP Latest Ref Rng & Units 04/16/2018  Glucose 65 - 99 mg/dL 91  BUN 6 - 20 mg/dL 7  Creatinine 0.44 - 1.00 mg/dL 0.64  Sodium 135 - 145 mmol/L 139  Potassium 3.5 - 5.1 mmol/L 3.9  Chloride 101 - 111 mmol/L 108  CO2 22 - 32 mmol/L 23  Calcium 8.9 - 10.3 mg/dL 9.0  Total Protein 6.5 - 8.1 g/dL 7.5  Total Bilirubin 0.3 - 1.2 mg/dL 0.4  Alkaline Phos 38 - 126 U/L 51  AST 15 - 41 U/L 16  ALT 14 - 54 U/L 18   Edinburgh Score: Edinburgh Postnatal Depression Scale Screening Tool 06/30/2021  I have been able to laugh and see the funny side of things. (No Data)      After visit meds:  Allergies as of 07/01/2021       Reactions   Nsaids Other (See Comments)   History of gastritis with hemorrhage   Other Swelling   Gain brand cleaning- dish detergent    Ammonium-containing Compounds  Ibuprofen Nausea Only   gastritis        Medication List     TAKE these medications    EPINEPHrine 0.3 mg/0.3 mL Soaj injection Commonly known as: EPI-PEN Inject 0.3 mg into the muscle as needed (for anaphylaxis shock).   ferrous sulfate 325 (65 FE) MG tablet Take 325 mg by mouth daily with breakfast.   HYDROmorphone 4 MG tablet Commonly known as: DILAUDID Take 1 tablet (4 mg total) by mouth every 4 (four) hours as needed for up to 7 days for severe pain.   multivitamin-prenatal 27-0.8 MG Tabs tablet Take 1 tablet by mouth daily at 12 noon.   valACYclovir 500 MG tablet Commonly known as: VALTREX Take 500 mg by mouth daily.         Discharge home in stable condition Infant Feeding: Bottle and Breast Infant Disposition:home with mother Discharge instruction: per  After Visit Summary and Postpartum booklet. Activity: Advance as tolerated. Pelvic rest for 6 weeks.  Diet: routine diet Anticipated Birth Control: Condoms Postpartum Appointment:6 weeks Additional Postpartum F/U:  n/a Future Appointments:No future appointments. Follow up Visit:  Follow-up Information     Servando Salina, MD Follow up in 6 week(s).   Specialty: Obstetrics and Gynecology Contact information: [address] [place] [address] [phone]                     07/01/2021 Marvene Staff, MD

## 2021-07-09 ENCOUNTER — Telehealth (HOSPITAL_COMMUNITY): Payer: Self-pay | Admitting: *Deleted

## 2021-07-09 NOTE — Telephone Encounter (Signed)
Patient reported having nipple pain while pumping. No other questions or concerns voiced at this time. RN provided patient with phone numbers for lactation message line and for outpatient lactation. EPDS = 2. Patient voiced no questions or concerns regarding baby at this time. Patient reported infant sleeps in a bassinet on his back. RN reviewed ABCs of safe sleep - patient verbalized understanding. Erline Levine, RN, 07/09/21, 1758.

## 2022-03-10 ENCOUNTER — Emergency Department (HOSPITAL_BASED_OUTPATIENT_CLINIC_OR_DEPARTMENT_OTHER): Payer: 59

## 2022-03-10 ENCOUNTER — Encounter (HOSPITAL_BASED_OUTPATIENT_CLINIC_OR_DEPARTMENT_OTHER): Payer: Self-pay | Admitting: *Deleted

## 2022-03-10 ENCOUNTER — Emergency Department (HOSPITAL_BASED_OUTPATIENT_CLINIC_OR_DEPARTMENT_OTHER)
Admission: EM | Admit: 2022-03-10 | Discharge: 2022-03-10 | Disposition: A | Discharge status: Home / Self Care | Payer: 59 | Attending: Emergency Medicine | Admitting: Emergency Medicine

## 2022-03-10 ENCOUNTER — Other Ambulatory Visit: Payer: Self-pay

## 2022-03-10 DIAGNOSIS — R1013 Epigastric pain: Secondary | ICD-10-CM

## 2022-03-10 DIAGNOSIS — K8070 Calculus of gallbladder and bile duct without cholecystitis without obstruction: Secondary | ICD-10-CM | POA: Insufficient documentation

## 2022-03-10 DIAGNOSIS — D649 Anemia, unspecified: Secondary | ICD-10-CM | POA: Insufficient documentation

## 2022-03-10 LAB — URINALYSIS, ROUTINE W REFLEX MICROSCOPIC
Bilirubin Urine: NEGATIVE
Glucose, UA: NEGATIVE mg/dL
Hgb urine dipstick: NEGATIVE
Ketones, ur: NEGATIVE mg/dL
Leukocytes,Ua: NEGATIVE
Nitrite: NEGATIVE
Protein, ur: NEGATIVE mg/dL
Specific Gravity, Urine: 1.02 (ref 1.005–1.030)
pH: 7.5 (ref 5.0–8.0)

## 2022-03-10 LAB — CBC
HCT: 34.5 % — ABNORMAL LOW (ref 36.0–46.0)
Hemoglobin: 11.8 g/dL — ABNORMAL LOW (ref 12.0–15.0)
MCH: 23.8 pg — ABNORMAL LOW (ref 26.0–34.0)
MCHC: 34.2 g/dL (ref 30.0–36.0)
MCV: 69.6 fL — ABNORMAL LOW (ref 80.0–100.0)
Platelets: 250 10*3/uL (ref 150–400)
RBC: 4.96 MIL/uL (ref 3.87–5.11)
RDW: 15.4 % (ref 11.5–15.5)
WBC: 8.1 10*3/uL (ref 4.0–10.5)
nRBC: 0 % (ref 0.0–0.2)

## 2022-03-10 LAB — COMPREHENSIVE METABOLIC PANEL
ALT: 21 U/L (ref 0–44)
AST: 17 U/L (ref 15–41)
Albumin: 3.9 g/dL (ref 3.5–5.0)
Alkaline Phosphatase: 54 U/L (ref 38–126)
Anion gap: 6 (ref 5–15)
BUN: 7 mg/dL (ref 6–20)
CO2: 24 mmol/L (ref 22–32)
Calcium: 9 mg/dL (ref 8.9–10.3)
Chloride: 106 mmol/L (ref 98–111)
Creatinine, Ser: 0.57 mg/dL (ref 0.44–1.00)
GFR, Estimated: >60 mL/min (ref >60)
Glucose, Bld: 109 mg/dL — ABNORMAL HIGH (ref 70–99)
Potassium: 3.6 mmol/L (ref 3.5–5.1)
Sodium: 136 mmol/L (ref 135–145)
Total Bilirubin: 0.4 mg/dL (ref 0.3–1.2)
Total Protein: 7.8 g/dL (ref 6.5–8.1)

## 2022-03-10 LAB — PREGNANCY, URINE: Preg Test, Ur: NEGATIVE

## 2022-03-10 LAB — LIPASE, BLOOD: Lipase: 27 U/L (ref 11–51)

## 2022-03-10 MED ORDER — ALUM & MAG HYDROXIDE-SIMETH 200-200-20 MG/5ML PO SUSP
30.0000 mL | Freq: Once | ORAL | Status: AC
  Administered 2022-03-10: 30 mL via ORAL
  Filled 2022-03-10: qty 30

## 2022-03-10 MED ORDER — LIDOCAINE VISCOUS HCL 2 % MT SOLN
15.0000 mL | Freq: Once | OROMUCOSAL | Status: AC
  Administered 2022-03-10: 15 mL via ORAL
  Filled 2022-03-10: qty 15

## 2022-03-10 MED ORDER — PANTOPRAZOLE SODIUM 20 MG PO TBEC: 20.0000 mg | DELAYED_RELEASE_TABLET | Freq: Every day | ORAL | 0 refills | Status: AC

## 2022-03-10 MED ORDER — PANTOPRAZOLE SODIUM 40 MG IV SOLR
40.0000 mg | Freq: Once | INTRAVENOUS | Status: AC
  Administered 2022-03-10: 40 mg via INTRAVENOUS
  Filled 2022-03-10: qty 10

## 2022-03-10 MED ORDER — IOHEXOL 300 MG/ML  SOLN
100.0000 mL | Freq: Once | INTRAMUSCULAR | Status: AC | PRN
  Administered 2022-03-10: 100 mL via INTRAVENOUS

## 2022-03-10 MED ORDER — SUCRALFATE 1 G PO TABS: 1.0000 g | ORAL_TABLET | Freq: Three times a day (TID) | ORAL | 0 refills | Status: AC

## 2022-03-10 MED ORDER — MYLANTA MAXIMUM STRENGTH 400-400-40 MG/5ML PO SUSP: 15.0000 mL | Freq: Four times a day (QID) | ORAL | 0 refills | Status: AC | PRN

## 2022-03-10 MED ORDER — ONDANSETRON HCL 4 MG/2ML IJ SOLN
4.0000 mg | Freq: Once | INTRAMUSCULAR | Status: AC
  Administered 2022-03-10: 4 mg via INTRAVENOUS
  Filled 2022-03-10: qty 2

## 2022-03-10 MED ORDER — MORPHINE SULFATE (PF) 4 MG/ML IV SOLN
4.0000 mg | Freq: Once | INTRAVENOUS | Status: AC
  Administered 2022-03-10: 4 mg via INTRAVENOUS
  Filled 2022-03-10: qty 1

## 2022-03-10 NOTE — Discharge Instructions (Addendum)
You came to the emergency department today to be evaluated for your abdominal pain.  The ultrasound imaging and CT scan showed that you have gallstones.  This is unlikely to be causing your pain.  Due to concern for possible gastritis versus ulcers we will prescribe you with pantoprazole, Carafate, and Mylanta.  Please take these medications as prescribed.  Please follow-up closely with your gastroenterologist for further evaluation. ? ?Get help right away if: ?Your pain does not go away as soon as your health care provider told you to expect. ?You cannot stop vomiting. ?Your pain is only in areas of the abdomen, such as the right side or the left lower portion of the abdomen. Pain on the right side could be caused by appendicitis. ?You have bloody or black stools, or stools that look like tar. ?You have severe pain, cramping, or bloating in your abdomen. ?You have signs of dehydration, such as: ?Dark urine, very little urine, or no urine. ?Cracked lips. ?Dry mouth. ?Sunken eyes. ?Sleepiness. ?Weakness. ?You have trouble breathing or chest pain. ?

## 2022-03-10 NOTE — ED Provider Notes (Signed)
?[facility] ?Provider Note ? ? ?CSN: [id number] ?Arrival date & time: 03/10/22  1017 ? ?  ? ?History ? ?Chief Complaint  ?Patient presents with  ? Abdominal Pain  ? ? ?Deborah Weber is a 26 y.o. female with medical history of postpartum depression, status post cesarean section, gastritis, irritable bowel syndrome, migraine.  Presents to the emergency department with a chief complaint of epigastric abdominal pain.  Patient reports that pain woke her from sleep at 3 AM.  Pain has been constant since then.  Patient describes pain as a "dull not."  Patient rates pain 7/10 on the pain scale.  Denies any radiation of pain.  Pain has remained the same since starting this morning.  Pain is worse with touch.  Patient Dors is nausea and vomiting.  States she vomited 4 times in the last 24 hours.  Patient denies any hematemesis or coffee-ground emesis. ? ?Patient endorses drinking 2 alcoholic beverages per week.  Endorses marijuana use.  Denies any NSAID use. ? ?Patient denies any fever, chills, abdominal distention, constipation, diarrhea, blood in stool, melena,, dysuria, hematuria, urinary urgency, vaginal pain, vaginal bleeding, vaginal discharge, lightheadedness, syncope. ? ? ?Abdominal Pain ?Associated symptoms: nausea and vomiting   ?Associated symptoms: no chest pain, no chills, no constipation, no diarrhea, no dysuria, no fever, no hematuria, no shortness of breath, no vaginal bleeding and no vaginal discharge   ? ?  ? ?Home Medications ?Prior to Admission medications   ?Medication Sig Start Date End Date Taking? Authorizing Provider  ?EPINEPHrine 0.3 mg/0.3 mL IJ SOAJ injection Inject 0.3 mg into the muscle as needed (for anaphylaxis shock).  09/07/16   [provider]  ?ferrous sulfate 325 (65 FE) MG tablet Take 325 mg by mouth daily with breakfast.    [provider]  ?Prenatal Vit-Fe Fumarate-FA (MULTIVITAMIN-PRENATAL) 27-0.8 MG TABS tablet Take 1 tablet by mouth  daily at 12 noon.    [provider]  ?valACYclovir (VALTREX) 500 MG tablet Take 500 mg by mouth daily. 06/05/21   [provider]  ?   ? ?Allergies    ?Nsaids, Other, Ammonium-containing compounds, and Ibuprofen   ? ?Review of Systems   ?Review of Systems  ?Constitutional:  Negative for chills and fever.  ?Respiratory:  Negative for shortness of breath.   ?Cardiovascular:  Negative for chest pain.  ?Gastrointestinal:  Positive for abdominal pain, nausea and vomiting. Negative for abdominal distention, anal bleeding, blood in stool, constipation, diarrhea and rectal pain.  ?Genitourinary:  Negative for difficulty urinating, dysuria, frequency, genital sores, hematuria, urgency, vaginal bleeding, vaginal discharge and vaginal pain.  ?Musculoskeletal:  Negative for back pain and neck pain.  ?Skin:  Negative for color change and rash.  ?Neurological:  Negative for dizziness, syncope, light-headedness and headaches.  ?Psychiatric/Behavioral:  Negative for confusion.   ? ?Physical Exam ?Updated Vital Signs ?BP (!) 131/97 (BP Location: Left Arm)   Pulse 99   Temp 98.3 ?F (36.8 ?C) (Oral)   Resp 16   Wt 86.2 kg   LMP 02/22/2022 (Exact Date)   SpO2 100%   BMI 28.06 kg/m?  ?Physical Exam ?Vitals and nursing note reviewed.  ?Constitutional:   ?   General: She is not in acute distress. ?   Appearance: She is not ill-appearing, toxic-appearing or diaphoretic.  ?HENT:  ?   Head: Normocephalic.  ?Eyes:  ?   General: No scleral icterus.    ?   Right eye: No discharge.     ?  Left eye: No discharge.  ?Cardiovascular:  ?   Rate and Rhythm: Normal rate.  ?Pulmonary:  ?   Effort: Pulmonary effort is normal.  ?Abdominal:  ?   General: Abdomen is flat. Bowel sounds are normal. There is no distension.  ?   Palpations: Abdomen is soft. There is no mass or pulsatile mass.  ?   Tenderness: There is abdominal tenderness in the right upper quadrant and epigastric area. There is no guarding or rebound. Negative signs  include Murphy's sign and McBurney's sign.  ?   Hernia: There is no hernia in the umbilical area or ventral area.  ?Skin: ?   General: Skin is warm and dry.  ?Neurological:  ?   General: No focal deficit present.  ?   Mental Status: She is alert.  ?Psychiatric:     ?   Behavior: Behavior is cooperative.  ? ? ?ED Results / Procedures / Treatments   ?Labs ?(all labs ordered are listed, but only abnormal results are displayed) ?Labs Reviewed  ?COMPREHENSIVE METABOLIC PANEL - Abnormal; Notable for the following components:  ?    Result Value  ? Glucose, Bld 109 (*)   ? All other components within normal limits  ?CBC - Abnormal; Notable for the following components:  ? Hemoglobin 11.8 (*)   ? HCT 34.5 (*)   ? MCV 69.6 (*)   ? MCH 23.8 (*)   ? All other components within normal limits  ?LIPASE, BLOOD  ?URINALYSIS, ROUTINE W REFLEX MICROSCOPIC  ?PREGNANCY, URINE  ? ? ?EKG ?None ? ?Radiology ?CT ABDOMEN PELVIS W CONTRAST ? ?Result Date: 03/10/2022 ?CLINICAL DATA:  Epigastric pain EXAM: CT ABDOMEN AND PELVIS WITH CONTRAST TECHNIQUE: Multidetector CT imaging of the abdomen and pelvis was performed using the standard protocol following bolus administration of intravenous contrast. RADIATION DOSE REDUCTION: This exam was performed according to the departmental dose-optimization program which includes automated exposure control, adjustment of the mA and/or kV according to patient size and/or use of iterative reconstruction technique. CONTRAST:  175m OMNIPAQUE IOHEXOL 300 MG/ML  SOLN COMPARISON:  CT abdomen and pelvis 11/09/2017, abdominal ultrasound 03/10/2022 FINDINGS: Lower chest: No acute abnormality. Hepatobiliary: Liver is normal in size and contour with no suspicious mass identified. Mild gallbladder wall thickening. No significant biliary ductal dilatation identified. Common bile duct measures 5 mm diameter. Pancreas: Unremarkable. No pancreatic ductal dilatation or surrounding inflammatory changes. Spleen: Normal in size  without focal abnormality. Adrenals/Urinary Tract: Adrenal glands are unremarkable. Kidneys are normal, without renal calculi, focal lesion, or hydronephrosis. Bladder is unremarkable. Stomach/Bowel: Stomach is within normal limits. Appendix appears normal. No evidence of bowel wall thickening, distention, or inflammatory changes. Moderate amount of retained fecal material throughout the colon and rectum. Vascular/Lymphatic: No significant vascular findings are present. No enlarged abdominal or pelvic lymph nodes. Reproductive: Uterus is unremarkable. Right ovarian/adnexal hypodense cyst measuring 3.5 x 2.1 cm, no follow-up imaging required. Other: No ascites.  Tiny umbilical hernia containing fat. Musculoskeletal: No acute or significant osseous findings. IMPRESSION: Mild gallbladder wall thickening. Cholelithiasis visualized on earlier ultrasound. Electronically Signed   By: DOfilia NeasM.D.   On: 03/10/2022 13:01  ? ?UKoreaAbdomen Limited RUQ (LIVER/GB) ? ?Result Date: 03/10/2022 ?CLINICAL DATA:  RIGHT upper quadrant pain EXAM: ULTRASOUND ABDOMEN LIMITED RIGHT UPPER QUADRANT COMPARISON:  None FINDINGS: Gallbladder: Shadowing calculus in gallbladder 10 mm diameter. Additional tiny calculi. Small amount of sludge. No gallbladder wall thickening, pericholecystic fluid, or sonographic Murphy sign. Common bile duct: Diameter: 4 mm, normal Liver:  Normal echogenicity without mass or nodularity. No intrahepatic biliary dilatation. Portal vein is patent on color Doppler imaging with normal direction of blood flow towards the liver. Other: No RIGHT upper quadrant free fluid. IMPRESSION: Cholelithiasis and small amount of sludge within gallbladder. No evidence of acute cholecystitis or biliary obstruction. Electronically Signed   By: Lavonia Dana M.D.   On: 03/10/2022 11:28   ? ?Procedures ?Procedures  ? ? ?Medications Ordered in ED ?Medications  ?morphine (PF) 4 MG/ML injection 4 mg (4 mg Intravenous Given 03/10/22 1055)   ?ondansetron (ZOFRAN) injection 4 mg (4 mg Intravenous Given 03/10/22 1055)  ?pantoprazole (PROTONIX) injection 40 mg (40 mg Intravenous Given 03/10/22 1055)  ?alum & mag hydroxide-simeth (MAALOX/MYLANTA) [phone]

## 2022-03-10 NOTE — ED Triage Notes (Signed)
Pt is here for upper abdominal pain which woke her up at 3am.  Pt repots nausea and vomiting with this.   ?

## 2022-03-13 ENCOUNTER — Ambulatory Visit: Payer: Self-pay | Admitting: General Surgery

## 2022-03-13 NOTE — H&P (Signed)
?PATIENT PROFILE: ?Deborah Weber is a 26 y.o. female who presents to the Clinic for consultation at the request of Dr. Matilde Bash for evaluation of cholelithiasis. ? ?PCP:  Margarita Rana, MD ? ?HISTORY OF PRESENT ILLNESS: ?Deborah Weber reports having abdominal pain for the last month.  The pain started in the epigastric area which radiates to the right upper quadrant.  Then the pain radiates to the back and right shoulder.  Pain is today better by eating.  Pain basically aggravated by any meal.  No alleviating factors.  Patient went to the ED due to nausea vomiting and she had an ultrasound done showing cholelithiasis.  No sign of cholecystitis.  I personally evaluated the images of the ultrasound.  Labs showed no leukocytosis, normal AST and ALT, normal bilirubin and alkaline phosphatase.  I personally evaluated the labs. ? ? ?PROBLEM LIST: ?Problem List  Date Reviewed: 11/27/2020  ? ?       Noted  ? Postpartum depression 11/24/2021  ? Genital herpes simplex 01/08/2020  ? Migraine 01/08/2020  ? Herpes simplex vulvovaginitis 05/09/2019  ? Overview  ?  Frequent outbreaks on valtrex 1000 mg a day suppresive therapy. Switched to acyclovir 08/2019 ?  ?  ? Nexplanon in place 11/07/2018  ? Irritable bowel syndrome with diarrhea 04/21/2018  ? Chronic abdominal pain, unspecified 04/21/2018  ? Nausea 04/21/2018  ? Gastroesophageal reflux disease without esophagitis 04/21/2018  ? Esophageal dysphagia 01/11/2018  ? Other insomnia 01/11/2018  ? Odynophagia, unspecified 01/11/2018  ? Inappropriate sinus tachycardia 01/06/2018  ? Palpitations 12/23/2017  ? Overview  ?  has had negative workup ?  ?  ? Tachycardia, unspecified 12/23/2017  ? Exertional shortness of breath 12/23/2017  ? Bradycardia 06/03/2017  ? Overview  ?  asymptomatic ?  ?  ? Encounter for contraceptive management 06/01/2017  ? ? ?GENERAL REVIEW OF SYSTEMS:  ? ?General ROS: negative for - chills, fatigue, fever, weight gain or weight loss ?Allergy and Immunology ROS: negative  for - hives  ?Hematological and Lymphatic ROS: negative for - bleeding problems or bruising, negative for palpable nodes ?Endocrine ROS: negative for - heat or cold intolerance, hair changes ?Respiratory ROS: negative for - cough, shortness of breath or wheezing ?Cardiovascular ROS: no chest pain or palpitations ?GI ROS: Positive for nausea, vomiting, positive for abdominal pain ?Musculoskeletal ROS: negative for - joint swelling or muscle pain ?Neurological ROS: negative for - confusion, syncope ?Dermatological ROS: negative for pruritus and rash ?Psychiatric: negative for anxiety, depression, difficulty sleeping and memory loss ? ?MEDICATIONS: ?Current Outpatient Medications  ?Medication Sig Dispense Refill  ? acetaminophen (TYLENOL) 500 MG tablet Take 1,000 mg by mouth every 6 (six) hours as needed    ? acyclovir (ZOVIRAX) 400 MG tablet Take 1 tablet (400 mg total) by mouth 2 (two) times daily (Patient taking differently: Take 500 mg by mouth 2 (two) times daily) 60 tablet 11  ? etonogestrel (NEXPLANON) 68 mg implant Inject 1 each into the skin once.    ? ferrous sulfate 325 (65 FE) MG tablet Take 325 mg by mouth daily with breakfast    ? melatonin 5 mg Tab Take 5 mg by mouth at bedtime    ? pantoprazole (PROTONIX) 40 MG DR tablet Take 1 tablet by mouth once daily    ? valACYclovir (VALTREX) 500 MG tablet Take 1 tablet (500 mg total) by mouth 2 (two) times daily for 15 days 30 tablet 0  ? ?No current facility-administered medications for this visit.  ? ? ?  ALLERGIES: ?Nsaids (non-steroidal anti-inflammatory drug), Other, and Ibuprofen ? ?PAST MEDICAL HISTORY: ?Past Medical History:  ?Diagnosis Date  ? Acute gastric ulcer with hemorrhage 06/09/2017  ? Bradycardia 06/03/2017  ? Palpitations   ? has had negative workup  ? Tachycardia, unspecified   ? Urethral prolapse   ? ? ?PAST SURGICAL HISTORY: ?Past Surgical History:  ?Procedure Laterality Date  ? EGD  01/07/2018  ? Gastritis/No repeat/TKT  ? COLONOSCOPY  01/07/2018   ? Negative colon biopsies/Repeat in 24 years/TKT  ? breast biopsy    ? ORIF ANKLE FRACTURE    ?  ? ?FAMILY HISTORY: ?Family History  ?Problem Relation Age of Onset  ? High blood pressure (Hypertension) Father   ? Coronary Artery Disease (Blocked arteries around heart) Father   ? Stroke Father   ? Diabetes type II Paternal Uncle   ? High blood pressure (Hypertension) Maternal Grandmother   ? Osteoporosis (Thinning of bones) Maternal Grandmother   ? Deep vein thrombosis (DVT or abnormal blood clot formation) Maternal Grandfather   ? Diabetes type II Paternal Grandmother   ? Breast cancer Paternal Grandmother   ?  ? ?SOCIAL HISTORY: ?Social History  ? ?Socioeconomic History  ? Marital status: Single  ?Tobacco Use  ? Smoking status: Some Days  ? Smokeless tobacco: Never  ?Vaping Use  ? Vaping Use: Never used  ?Substance and Sexual Activity  ? Alcohol use: Yes  ?  Alcohol/week: 0.0 standard drinks  ?  Comment: occ, not even weekly  ? Drug use: No  ? Sexual activity: Yes  ?Social History Narrative  ? Lives with mother, stepfather, sister, brother. High school graduate and will start at [employer] 92015/2016). Has played basketball in past, until she broke her ankle. Knows she needs to get back to exercising.    ? ? ?PHYSICAL EXAM: ?Vitals:  ? 03/13/22 0803  ?BP: 137/88  ?Pulse: 88  ? ?Body mass index is 30.11 kg/m?. ?Weight: 89.8 kg (198 lb)  ? ?GENERAL: Alert, active, oriented x3 ? ?HEENT: Pupils equal reactive to light. Extraocular movements are intact. Sclera clear. Palpebral conjunctiva normal red color.Pharynx clear. ? ?NECK: Supple with no palpable mass and no adenopathy. ? ?LUNGS: Sound clear with no rales rhonchi or wheezes. ? ?HEART: Regular rhythm S1 and S2 without murmur. ? ?ABDOMEN: Soft and depressible, nontender with no palpable mass, no hepatomegaly.  ? ?EXTREMITIES: Well-developed well-nourished symmetrical with no dependent edema. ? ?NEUROLOGICAL: Awake alert oriented, facial expression symmetrical, moving  all extremities. ? ?REVIEW OF DATA: ?I have reviewed the following data today: ?No visits with results within 3 Month(s) from this visit.  ?Latest known visit with results is:  ?Office Visit on 11/24/2021  ?Component Date Value  ? Cholesterol, Total 11/24/2021 215   ? LDL Calculated 11/24/2021 156   ? HDL 11/24/2021 45   ? Triglyceride 11/24/2021 72   ? Thyroid Stimulating Horm* 11/24/2021 0.83   ? Sodium 11/24/2021 138   ? Potassium 11/24/2021 4.0   ? Chloride 11/24/2021 105   ? Carbon Dioxide (CO2) 11/24/2021 24   ? Urea Nitrogen (BUN) 11/24/2021 9   ? Creatinine 11/24/2021 0.7   ? Glucose 11/24/2021 85   ? Calcium 11/24/2021 9.4   ? AST (Aspartate Aminotran* 11/24/2021 16   ? ALT (Alanine Aminotransf* 11/24/2021 22   ? Bilirubin, Total 11/24/2021 0.4   ? Alk Phos (Alkaline Phosp* 11/24/2021 55   ? Albumin 11/24/2021 3.9   ? Protein, Total 11/24/2021 7.7   ? Anion Gap 11/24/2021 9   ?  BUN/CREA Ratio 11/24/2021 13   ? Glomerular Filtration Ra* 11/24/2021 123   ? WBC (White Blood Cell Co* 11/24/2021 5.0   ? Hemoglobin 11/24/2021 11.7 (L)   ? Hematocrit 11/24/2021 34.1 (L)   ? Platelets 11/24/2021 285   ? MCV (Mean Corpuscular Vo* 11/24/2021 70 (L)   ? MCH (Mean Corpuscular He* 11/24/2021 23.9 (L)   ? MCHC (Mean Corpuscular H* 11/24/2021 34.3   ? RBC (Red Blood Cell Coun* 11/24/2021 4.90   ? RDW-CV (Red Cell Distrib* 11/24/2021 15.4 (H)   ? NRBC (Nucleated Red Bloo* 11/24/2021 0.00   ? NRBC % (Nucleated Red Bl* 11/24/2021 0.0   ? MPV (Mean Platelet Volum* 11/24/2021 11.4   ? Neutrophil Count 11/24/2021 2.7   ? Neutrophil % 11/24/2021 54.4   ? Lymphocyte Count 11/24/2021 1.8   ? Lymphocyte % 11/24/2021 35.4   ? Monocyte Count 11/24/2021 0.4   ? Monocyte % 11/24/2021 8.8   ? Eosinophil Count 11/24/2021 0.05   ? Eosinophil % 11/24/2021 1.0   ? Basophil Count 11/24/2021 0.01   ? Basophil % 11/24/2021 0.2   ? Slide Review/Morphology 11/24/2021 Yes   ? Immature Granulocyte Cou* 11/24/2021 0.01   ? Immature Granulocyte %  11/24/2021 0.2   ?  ? ?ASSESSMENT: ?Ms. Coonrod is a 26 y.o. female presenting for consultation for cholelithiasis.   ? ?Patient was oriented about the diagnosis of cholelithiasis. Also oriented about

## 2022-03-16 ENCOUNTER — Observation Stay (HOSPITAL_BASED_OUTPATIENT_CLINIC_OR_DEPARTMENT_OTHER)
Admission: EM | Admit: 2022-03-16 | Discharge: 2022-03-19 | Disposition: A | Discharge status: Home / Self Care | Payer: 59 | Attending: Emergency Medicine | Admitting: Emergency Medicine

## 2022-03-16 ENCOUNTER — Emergency Department (HOSPITAL_COMMUNITY): Payer: 59

## 2022-03-16 ENCOUNTER — Encounter (HOSPITAL_COMMUNITY): Payer: Self-pay | Admitting: Emergency Medicine

## 2022-03-16 ENCOUNTER — Emergency Department (HOSPITAL_COMMUNITY)
Admission: EM | Admit: 2022-03-16 | Discharge: 2022-03-16 | Discharge status: Against Medical Advice | Payer: 59 | Source: Home / Self Care

## 2022-03-16 ENCOUNTER — Other Ambulatory Visit: Payer: Self-pay

## 2022-03-16 ENCOUNTER — Encounter (HOSPITAL_BASED_OUTPATIENT_CLINIC_OR_DEPARTMENT_OTHER): Payer: Self-pay | Admitting: Emergency Medicine

## 2022-03-16 ENCOUNTER — Inpatient Hospital Stay: Admission: RE | Admit: 2022-03-16 | Payer: 59 | Source: Ambulatory Visit

## 2022-03-16 DIAGNOSIS — R112 Nausea with vomiting, unspecified: Secondary | ICD-10-CM | POA: Insufficient documentation

## 2022-03-16 DIAGNOSIS — Z87891 Personal history of nicotine dependence: Secondary | ICD-10-CM | POA: Insufficient documentation

## 2022-03-16 DIAGNOSIS — Z5321 Procedure and treatment not carried out due to patient leaving prior to being seen by health care provider: Secondary | ICD-10-CM | POA: Insufficient documentation

## 2022-03-16 DIAGNOSIS — R1011 Right upper quadrant pain: Secondary | ICD-10-CM

## 2022-03-16 DIAGNOSIS — K8000 Calculus of gallbladder with acute cholecystitis without obstruction: Principal | ICD-10-CM | POA: Diagnosis present

## 2022-03-16 LAB — COMPREHENSIVE METABOLIC PANEL
ALT: 30 U/L (ref 0–44)
AST: 22 U/L (ref 15–41)
Albumin: 3.7 g/dL (ref 3.5–5.0)
Alkaline Phosphatase: 53 U/L (ref 38–126)
Anion gap: 7 (ref 5–15)
BUN: 8 mg/dL (ref 6–20)
CO2: 25 mmol/L (ref 22–32)
Calcium: 9.5 mg/dL (ref 8.9–10.3)
Chloride: 103 mmol/L (ref 98–111)
Creatinine, Ser: 0.7 mg/dL (ref 0.44–1.00)
GFR, Estimated: >60 mL/min (ref >60)
Glucose, Bld: 109 mg/dL — ABNORMAL HIGH (ref 70–99)
Potassium: 4.3 mmol/L (ref 3.5–5.1)
Sodium: 135 mmol/L (ref 135–145)
Total Bilirubin: 0.2 mg/dL — ABNORMAL LOW (ref 0.3–1.2)
Total Protein: 7.4 g/dL (ref 6.5–8.1)

## 2022-03-16 LAB — CBC WITH DIFFERENTIAL/PLATELET
Abs Immature Granulocytes: 0.02 10*3/uL (ref 0.00–0.07)
Basophils Absolute: 0 10*3/uL (ref 0.0–0.1)
Basophils Relative: 0 %
Eosinophils Absolute: 0.1 10*3/uL (ref 0.0–0.5)
Eosinophils Relative: 1 %
HCT: 35.2 % — ABNORMAL LOW (ref 36.0–46.0)
Hemoglobin: 11.9 g/dL — ABNORMAL LOW (ref 12.0–15.0)
Immature Granulocytes: 0 %
Lymphocytes Relative: 28 %
Lymphs Abs: 2 10*3/uL (ref 0.7–4.0)
MCH: 24.2 pg — ABNORMAL LOW (ref 26.0–34.0)
MCHC: 33.8 g/dL (ref 30.0–36.0)
MCV: 71.5 fL — ABNORMAL LOW (ref 80.0–100.0)
Monocytes Absolute: 0.5 10*3/uL (ref 0.1–1.0)
Monocytes Relative: 7 %
Neutro Abs: 4.5 10*3/uL (ref 1.7–7.7)
Neutrophils Relative %: 64 %
Platelets: 261 10*3/uL (ref 150–400)
RBC: 4.92 MIL/uL (ref 3.87–5.11)
RDW: 15.5 % (ref 11.5–15.5)
WBC: 7.2 10*3/uL (ref 4.0–10.5)
nRBC: 0 % (ref 0.0–0.2)

## 2022-03-16 LAB — URINALYSIS, ROUTINE W REFLEX MICROSCOPIC
Bacteria, UA: NONE SEEN
Bilirubin Urine: NEGATIVE
Glucose, UA: NEGATIVE mg/dL
Ketones, ur: NEGATIVE mg/dL
Leukocytes,Ua: NEGATIVE
Nitrite: NEGATIVE
Protein, ur: NEGATIVE mg/dL
Specific Gravity, Urine: 1.02 (ref 1.005–1.030)
pH: 5 (ref 5.0–8.0)

## 2022-03-16 LAB — LIPASE, BLOOD: Lipase: 29 U/L (ref 11–51)

## 2022-03-16 LAB — SURGICAL PCR SCREEN
MRSA, PCR: NEGATIVE
Staphylococcus aureus: NEGATIVE

## 2022-03-16 LAB — HCG, SERUM, QUALITATIVE: Preg, Serum: NEGATIVE

## 2022-03-16 MED ORDER — ONDANSETRON 4 MG PO TBDP
4.0000 mg | ORAL_TABLET | Freq: Four times a day (QID) | ORAL | Status: DC | PRN
Start: 2022-03-16 — End: 2022-03-19

## 2022-03-16 MED ORDER — ONDANSETRON HCL 4 MG/2ML IJ SOLN
4.0000 mg | Freq: Once | INTRAMUSCULAR | Status: AC
  Administered 2022-03-16: 4 mg via INTRAVENOUS
  Filled 2022-03-16: qty 2

## 2022-03-16 MED ORDER — ENOXAPARIN SODIUM 40 MG/0.4ML IJ SOSY
40.0000 mg | PREFILLED_SYRINGE | INTRAMUSCULAR | Status: DC
  Administered 2022-03-16 – 2022-03-18 (×3): 40 mg via SUBCUTANEOUS
  Filled 2022-03-16 (×3): qty 0.4

## 2022-03-16 MED ORDER — MORPHINE SULFATE (PF) 2 MG/ML IV SOLN
2.0000 mg | Freq: Once | INTRAVENOUS | Status: AC
  Administered 2022-03-16: 2 mg via INTRAVENOUS
  Filled 2022-03-16: qty 1

## 2022-03-16 MED ORDER — SODIUM CHLORIDE 0.9 % IV BOLUS
1000.0000 mL | Freq: Once | INTRAVENOUS | Status: AC
  Administered 2022-03-16: 1000 mL via INTRAVENOUS

## 2022-03-16 MED ORDER — FENTANYL CITRATE PF 50 MCG/ML IJ SOSY
100.0000 ug | PREFILLED_SYRINGE | Freq: Once | INTRAMUSCULAR | Status: AC
  Administered 2022-03-16: 100 ug via INTRAVENOUS
  Filled 2022-03-16: qty 2

## 2022-03-16 MED ORDER — ACETAMINOPHEN 325 MG PO TABS: 650.0000 mg | ORAL_TABLET | Freq: Four times a day (QID) | ORAL | Status: DC | PRN

## 2022-03-16 MED ORDER — POTASSIUM CHLORIDE IN NACL 20-0.9 MEQ/L-% IV SOLN
INTRAVENOUS | Status: DC
  Filled 2022-03-16 (×5): qty 1000

## 2022-03-16 MED ORDER — ACETAMINOPHEN 650 MG RE SUPP: 650.0000 mg | Freq: Four times a day (QID) | RECTAL | Status: DC | PRN

## 2022-03-16 MED ORDER — OXYCODONE-ACETAMINOPHEN 5-325 MG PO TABS
2.0000 | ORAL_TABLET | Freq: Once | ORAL | Status: AC
  Administered 2022-03-16: 2 via ORAL
  Filled 2022-03-16: qty 2

## 2022-03-16 MED ORDER — DIPHENHYDRAMINE HCL 50 MG/ML IJ SOLN
25.0000 mg | Freq: Four times a day (QID) | INTRAMUSCULAR | Status: DC | PRN
  Administered 2022-03-17 – 2022-03-18 (×5): 25 mg via INTRAVENOUS
  Filled 2022-03-16 (×5): qty 1

## 2022-03-16 MED ORDER — OXYCODONE HCL 5 MG PO TABS
5.0000 mg | ORAL_TABLET | ORAL | Status: DC | PRN
  Administered 2022-03-16: 5 mg via ORAL
  Administered 2022-03-16 – 2022-03-18 (×7): 10 mg via ORAL
  Filled 2022-03-16 (×6): qty 2
  Filled 2022-03-16: qty 1
  Filled 2022-03-16: qty 2

## 2022-03-16 MED ORDER — DIPHENHYDRAMINE HCL 25 MG PO CAPS: 25.0000 mg | ORAL_CAPSULE | Freq: Four times a day (QID) | ORAL | Status: DC | PRN

## 2022-03-16 MED ORDER — ONDANSETRON HCL 4 MG/2ML IJ SOLN
4.0000 mg | Freq: Four times a day (QID) | INTRAMUSCULAR | Status: DC | PRN
  Administered 2022-03-16 – 2022-03-17 (×3): 4 mg via INTRAVENOUS
  Filled 2022-03-16 (×3): qty 2

## 2022-03-16 MED ORDER — MORPHINE SULFATE (PF) 4 MG/ML IV SOLN
4.0000 mg | INTRAVENOUS | Status: DC | PRN
  Administered 2022-03-16 – 2022-03-17 (×5): 4 mg via INTRAVENOUS
  Filled 2022-03-16 (×5): qty 1

## 2022-03-16 MED ORDER — PANTOPRAZOLE SODIUM 20 MG PO TBEC
20.0000 mg | DELAYED_RELEASE_TABLET | Freq: Every day | ORAL | Status: DC
  Administered 2022-03-18 – 2022-03-19 (×2): 20 mg via ORAL
  Filled 2022-03-16 (×3): qty 1

## 2022-03-16 MED ORDER — METOCLOPRAMIDE HCL 5 MG/ML IJ SOLN
10.0000 mg | Freq: Once | INTRAMUSCULAR | Status: AC | PRN
  Administered 2022-03-16: 10 mg via INTRAVENOUS
  Filled 2022-03-16: qty 2

## 2022-03-16 MED ORDER — LORATADINE 10 MG PO TABS
10.0000 mg | ORAL_TABLET | Freq: Every day | ORAL | Status: DC
  Administered 2022-03-18 – 2022-03-19 (×2): 10 mg via ORAL
  Filled 2022-03-16 (×2): qty 1

## 2022-03-16 MED ORDER — ONDANSETRON 4 MG PO TBDP
4.0000 mg | ORAL_TABLET | Freq: Once | ORAL | Status: AC
  Administered 2022-03-16: 4 mg via ORAL
  Filled 2022-03-16: qty 1

## 2022-03-16 MED ORDER — SODIUM CHLORIDE 0.9 % IV SOLN
2.0000 g | INTRAVENOUS | Status: DC
  Administered 2022-03-16 – 2022-03-17 (×2): 2 g via INTRAVENOUS
  Filled 2022-03-16 (×3): qty 20

## 2022-03-16 NOTE — ED Provider Triage Note (Signed)
Emergency Medicine Provider Triage Evaluation Note ? ?Deborah Weber , a 26 y.o. female  was evaluated in triage.  Pt complains of RUQ pain.  Known hx of gallstones, scheduled for cholecystectomy on Friday but pain acutely worsened last night.  States consistent pain x2 weeks, nausea, vomiting.  Cannot hold down food/fluids for the past 24 hours.  No currently on any pain medication. ? ?Review of Systems  ?Positive: Abdominal pain, vomiting ?Negative: fever ? ?Physical Exam  ?BP 131/90   Pulse (!) 103   Temp 98.6 ?F (37 ?C) (Oral)   Resp 18   LMP 02/22/2022 (Exact Date)   SpO2 100%  ?Gen:   Awake, no distress   ?Resp:  Normal effort  ?MSK:   Moves extremities without difficulty  ?Other:  Tender RUQ, appears uncomfortable ? ?Medical Decision Making  ?Medically screening exam initiated at 4:07 AM.  Appropriate orders placed.  Deborah Weber was informed that the remainder of the evaluation will be completed by another provider, this initial triage assessment does not replace that evaluation, and the importance of remaining in the ED until their evaluation is complete. ? ?RUQ pain.  Known gallstones.  Appears uncomfortable and tearful on exam.  TTP RUQ.  Will check labs, RUQ [place]. ?  ?Larene Pickett, PA-C ?03/16/22 0411 ? ?

## 2022-03-16 NOTE — ED Provider Notes (Signed)
? ?[facility] ?Provider Note: Georgena Spurling, MD, [place] ? ?CSN: [id number] ?MRN: [id number] ?ARRIVAL: 03/16/22 at [place] ?ROOM: [facility]05 ? ? ?CHIEF COMPLAINT  ?Abdominal Pain ? ? ?HISTORY OF PRESENT ILLNESS  ?03/16/22 5:18 AM ?Deborah Weber is a 26 y.o. female who has been having intermittent right upper quadrant abdominal pain for the past 2 weeks, worse after eating.  She was seen in the ED and diagnosed with cholelithiasis and is scheduled for cholecystectomy at [facility] 4 days from today.  Her pain has worsened and she is now unable to tolerate it.  She rates it as an 8 out of 10, located in the right upper quadrant radiating towards the mid abdomen and upper back.  She describes it as sharp.  It is worse with movement and palpation.  She was given 2 Percocet tablets and an ODT Zofran at approximately 415 this morning at [facility] which she states did not relieve her symptoms.  She has had 2 episodes of vomiting this morning. ? ? ?Past Medical History:  ?Diagnosis Date  ? Anemia   ? Benign neoplasm of breast   ? fibroadenoma right breast  ? Gastritis   ? Irritable bowel syndrome (IBS)   ? Migraine   ? Tachycardia   ? ? ?Past Surgical History:  ?Procedure Laterality Date  ? ANKLE SURGERY Left   ? BREAST BIOPSY Right 2013  ? excision fibroadenoma  ? CESAREAN SECTION N/A 06/28/2021  ? Procedure: CESAREAN SECTION;  Surgeon: Servando Salina, MD;  Location: [facility] LD ORS;  Service: Obstetrics;  Laterality: N/A;  ? ? ?Family History  ?Problem Relation Age of Onset  ? Hyperlipidemia Mother   ? Hypertension Father   ? Heart attack Father   ? Liver disease Father   ? Colon cancer Paternal Aunt 23  ? Breast cancer Paternal Grandmother 52  ? ? ?Social History  ? ?Tobacco Use  ? Smoking status: Former  ?  Types: Cigarettes  ?  Quit date: 10/28/2020  ?  Years since quitting: 1.3  ? Smokeless tobacco: Never  ? Tobacco comments:  ?  Quit w/+hpt  ?Substance Use Topics  ? Alcohol use: Not Currently  ?   Comment: occasionally  ? Drug use: No  ? ? ?Prior to Admission medications   ?Medication Sig Start Date End Date Taking? Authorizing Provider  ?alum & mag hydroxide-simeth (MYLANTA MAXIMUM STRENGTH) 400-400-40 MG/5ML suspension Take 15 mLs by mouth every 6 (six) hours as needed for indigestion. ?Patient not taking: Reported on 03/13/2022 03/10/22   Loni Beckwith, PA-C  ?ASHWAGANDHA PO Take 1 tablet by mouth daily.    [provider]  ?EPINEPHrine 0.3 mg/0.3 mL IJ SOAJ injection Inject 0.3 mg into the muscle as needed for anaphylaxis. 09/07/16   [provider]  ?etonogestrel (NEXPLANON) 68 MG IMPL implant 1 each by Subdermal route once.    [provider]  ?loratadine (CLARITIN) 10 MG tablet Take 10 mg by mouth daily.    [provider]  ?pantoprazole (PROTONIX) 20 MG tablet Take 1 tablet (20 mg total) by mouth daily. 03/10/22 04/09/22  Loni Beckwith, PA-C  ?sucralfate (CARAFATE) 1 g tablet Take 1 tablet (1 g total) by mouth 4 (four) times daily -  with meals and at bedtime for 14 days. 03/10/22 03/24/22  Loni Beckwith, PA-C  ?valACYclovir (VALTREX) 500 MG tablet Take 500 mg by mouth daily as needed (outbreak). 06/05/21   [provider]  ? ? ?Allergies ?Nsaids,  Other, Ammonium-containing compounds, and Ibuprofen ? ? ?REVIEW OF SYSTEMS  ?Negative except as noted here or in the History of Present Illness. ? ? ?PHYSICAL EXAMINATION  ?Initial Vital Signs ?Blood pressure (!) 138/92, pulse 91, temperature 98 ?F (36.7 ?C), temperature source Oral, resp. rate 16, height '5\' 8"'$  (1.727 m), weight 88.5 kg, last menstrual period 02/22/2022, SpO2 100 %, unknown if currently breastfeeding. ? ?Examination ?General: Well-developed, well-nourished female in no acute distress; appearance consistent with age of record ?HENT: normocephalic; atraumatic ?Eyes: Normal appearance ?Neck: supple ?Heart: regular rate and rhythm ?Lungs: clear to auscultation bilaterally ?Abdomen: soft;  nondistended; right upper quadrant tenderness; bowel sounds present ?Extremities: No deformity; full range of motion; pulses normal ?Neurologic: Awake, alert and oriented; motor function intact in all extremities and symmetric; no facial droop ?Skin: Warm and dry ?Psychiatric: Flat affect ? ? ?RESULTS  ?Summary of this visit's results, reviewed and interpreted by myself: ? ? EKG Interpretation ? ?Date/Time:    ?Ventricular Rate:    ?PR Interval:    ?QRS Duration:   ?QT Interval:    ?QTC Calculation:   ?R Axis:     ?Text Interpretation:   ?  ? ?  ? ?Laboratory Studies: ?Results for orders placed or performed during the hospital encounter of 03/16/22 (from the past 24 hour(s))  ?Urinalysis, Routine w reflex microscopic Urine, Clean Catch     Status: Abnormal  ? Collection Time: 03/16/22  4:09 AM  ?Result Value Ref Range  ? Color, Urine YELLOW YELLOW  ? APPearance HAZY (A) CLEAR  ? Specific Gravity, Urine 1.020 1.005 - 1.030  ? pH 5.0 5.0 - 8.0  ? Glucose, UA NEGATIVE NEGATIVE mg/dL  ? Hgb urine dipstick SMALL (A) NEGATIVE  ? Bilirubin Urine NEGATIVE NEGATIVE  ? Ketones, ur NEGATIVE NEGATIVE mg/dL  ? Protein, ur NEGATIVE NEGATIVE mg/dL  ? Nitrite NEGATIVE NEGATIVE  ? Leukocytes,Ua NEGATIVE NEGATIVE  ? RBC / HPF 0-5 0 - 5 RBC/hpf  ? WBC, UA 0-5 0 - 5 WBC/hpf  ? Bacteria, UA NONE SEEN NONE SEEN  ? Squamous Epithelial / LPF 6-10 0 - 5  ? Mucus PRESENT   ?CBC with Differential     Status: Abnormal  ? Collection Time: 03/16/22  4:22 AM  ?Result Value Ref Range  ? WBC 7.2 4.0 - 10.5 K/uL  ? RBC 4.92 3.87 - 5.11 MIL/uL  ? Hemoglobin 11.9 (L) 12.0 - 15.0 g/dL  ? HCT 35.2 (L) 36.0 - 46.0 %  ? MCV 71.5 (L) 80.0 - 100.0 fL  ? MCH 24.2 (L) 26.0 - 34.0 pg  ? MCHC 33.8 30.0 - 36.0 g/dL  ? RDW 15.5 11.5 - 15.5 %  ? Platelets 261 150 - 400 K/uL  ? nRBC 0.0 0.0 - 0.2 %  ? Neutrophils Relative % 64 %  ? Neutro Abs 4.5 1.7 - 7.7 K/uL  ? Lymphocytes Relative 28 %  ? Lymphs Abs 2.0 0.7 - 4.0 K/uL  ? Monocytes Relative 7 %  ? Monocytes  Absolute 0.5 0.1 - 1.0 K/uL  ? Eosinophils Relative 1 %  ? Eosinophils Absolute 0.1 0.0 - 0.5 K/uL  ? Basophils Relative 0 %  ? Basophils Absolute 0.0 0.0 - 0.1 K/uL  ? Immature Granulocytes 0 %  ? Abs Immature Granulocytes 0.02 0.00 - 0.07 K/uL  ?Comprehensive metabolic panel     Status: Abnormal  ? Collection Time: 03/16/22  4:22 AM  ?Result Value Ref Range  ? Sodium 135 135 - 145 mmol/L  ?  Potassium 4.3 3.5 - 5.1 mmol/L  ? Chloride 103 98 - 111 mmol/L  ? CO2 25 22 - 32 mmol/L  ? Glucose, Bld 109 (H) 70 - 99 mg/dL  ? BUN 8 6 - 20 mg/dL  ? Creatinine, Ser 0.70 0.44 - 1.00 mg/dL  ? Calcium 9.5 8.9 - 10.3 mg/dL  ? Total Protein 7.4 6.5 - 8.1 g/dL  ? Albumin 3.7 3.5 - 5.0 g/dL  ? AST 22 15 - 41 U/L  ? ALT 30 0 - 44 U/L  ? Alkaline Phosphatase 53 38 - 126 U/L  ? Total Bilirubin 0.2 (L) 0.3 - 1.2 mg/dL  ? GFR, Estimated >60 >60 mL/min  ? Anion gap 7 5 - 15  ?Lipase, blood     Status: None  ? Collection Time: 03/16/22  4:22 AM  ?Result Value Ref Range  ? Lipase 29 11 - 51 U/L  ? ?Imaging Studies: ?No results found. ? ?ED COURSE and MDM  ?Nursing notes, initial and subsequent vitals signs, including pulse oximetry, reviewed and interpreted by myself. ? ?Vitals:  ? 03/16/22 0459 03/16/22 0517  ?BP:  (!) 138/92  ?Pulse:  91  ?Resp:  16  ?Temp:  98 ?F (36.7 ?C)  ?TempSrc:  Oral  ?SpO2:  100%  ?Weight: 88.5 kg   ?Height: '5\' 8"'$  (1.727 m)   ? ?Medications  ?metoCLOPramide (REGLAN) injection 10 mg (has no administration in time range)  ?fentaNYL (SUBLIMAZE) injection 100 mcg (has no administration in time range)  ?ondansetron [facility]) injection 4 mg (4 mg Intravenous Given 03/16/22 0538)  ?fentaNYL (SUBLIMAZE) injection 100 mcg (100 mcg Intravenous Given 03/16/22 0537)  ?sodium chloride 0.9 % bolus 1,000 mL (1,000 mLs Intravenous New Bag/Given 03/16/22 0536)  ? ?6:37 AM ?Patient's laboratory studies are within normal limits but her pain persists.  I am concerned she may have a stone stuck in the neck of her gallbladder with  impending cholecystitis.  We will transfer her to the [facility] ED (Dr. Sedonia Small accepting) for ultrasound (our ultrasound technician has called in sick and we will not have ultrasound at this facility today) and possible

## 2022-03-16 NOTE — ED Notes (Signed)
Pt asked about wait time, pt stated she would be going to another hospital because she called them and they had no wait. Pt seen leaving ED. ?

## 2022-03-16 NOTE — ED Notes (Signed)
Report given to [facility] 

## 2022-03-16 NOTE — ED Triage Notes (Signed)
Patient here with abdominal pain, has known gallstones, has had pain for the last two week. She has nausea and vomiting.   ?

## 2022-03-16 NOTE — ED Notes (Signed)
Pt vomited 300cc after arriving to ED ?

## 2022-03-16 NOTE — H&P (Signed)
Deborah Weber is an 26 y.o. female.   ?Chief Complaint: RUQ pain ?HPI: This is a 26 year old female who presents with a few weeks of RUQ/ epigastric pain radiating through to her back, associated with nausea & vomiting.  Exacerbated by eating.  No diarrhea.  She was diagnosed with gallstones and was referred by [facility] in [place].  She is scheduled for robotic cholecystectomy later this week.  However, her symptoms have become more severe and she is unable to keep down any PO's.  She presented to [facility] ED for evaluation.  Repeat US showed mild wall thickening.  LFT's WNL.  We are consulted. ? ?Past Medical History:  ?Diagnosis Date  ? Anemia   ? Benign neoplasm of breast   ? fibroadenoma right breast  ? Gastritis   ? Irritable bowel syndrome (IBS)   ? Migraine   ? Tachycardia   ? ? ?Past Surgical History:  ?Procedure Laterality Date  ? ANKLE SURGERY Left   ? BREAST BIOPSY Right 2013  ? excision fibroadenoma  ? CESAREAN SECTION N/A 06/28/2021  ? Procedure: CESAREAN SECTION;  Surgeon: Servando Salina, MD;  Location: [facility] LD ORS;  Service: Obstetrics;  Laterality: N/A;  ? ? ?Family History  ?Problem Relation Age of Onset  ? Hyperlipidemia Mother   ? Hypertension Father   ? Heart attack Father   ? Liver disease Father   ? Colon cancer Paternal Aunt 82  ? Breast cancer Paternal Grandmother 39  ? ?Social History:  reports that she quit smoking about 16 months ago. Her smoking use included cigarettes. She has never used smokeless tobacco. She reports that she does not currently use alcohol. She reports that she does not use drugs. ? ?Allergies:  ?Allergies  ?Allergen Reactions  ? Nsaids Other (See Comments)  ?  History of gastritis with hemorrhage ?  ? Other Swelling  ?  Gain brand cleaning- dish detergent   ? Ammonium-Containing Compounds   ? Ibuprofen Nausea Only  ?  gastritis  ? ?Prior to Admission medications   ?Medication Sig Start Date End Date Taking? Authorizing Provider  ?alum & mag hydroxide-simeth  (MYLANTA MAXIMUM STRENGTH) 400-400-40 MG/5ML suspension Take 15 mLs by mouth every 6 (six) hours as needed for indigestion. ?Patient not taking: Reported on 03/13/2022 03/10/22   Loni Beckwith, PA-C  ?ASHWAGANDHA PO Take 1 tablet by mouth daily.    [provider]  ?EPINEPHrine 0.3 mg/0.3 mL IJ SOAJ injection Inject 0.3 mg into the muscle as needed for anaphylaxis. 09/07/16   [provider]  ?etonogestrel (NEXPLANON) 68 MG IMPL implant 1 each by Subdermal route once.    [provider]  ?loratadine (CLARITIN) 10 MG tablet Take 10 mg by mouth daily.    [provider]  ?pantoprazole (PROTONIX) 20 MG tablet Take 1 tablet (20 mg total) by mouth daily. 03/10/22 04/09/22  Loni Beckwith, PA-C  ?sucralfate (CARAFATE) 1 g tablet Take 1 tablet (1 g total) by mouth 4 (four) times daily -  with meals and at bedtime for 14 days. 03/10/22 03/24/22  Loni Beckwith, PA-C  ?valACYclovir (VALTREX) 500 MG tablet Take 500 mg by mouth daily as needed (outbreak). 06/05/21   [provider]  ? ? ? ?Lab Results  ?Component Value Date  ? WBC 7.2 03/16/2022  ? HGB 11.9 (L) 03/16/2022  ? HCT 35.2 (L) 03/16/2022  ? MCV 71.5 (L) 03/16/2022  ? PLT 261 03/16/2022  ? ?Lab Results  ?Component Value Date  ?  CREATININE 0.70 03/16/2022  ? BUN 8 03/16/2022  ? NA 135 03/16/2022  ? K 4.3 03/16/2022  ? CL 103 03/16/2022  ? CO2 25 03/16/2022  ? ? ?  Latest Ref Rng & Units 03/16/2022  ?  4:22 AM 03/10/2022  ? 10:38 AM 04/16/2018  ?  9:41 AM  ?Hepatic Function  ?Total Protein 6.5 - 8.1 g/dL 7.4   7.8   7.5    ?Albumin 3.5 - 5.0 g/dL 3.7   3.9   3.9    ?AST 15 - 41 U/L _0 ?ALT 0 - 44 U/L _1 ?Alk Phosphatase 38 - 126 U/L 53   54   51    ?Total Bilirubin 0.3 - 1.2 mg/dL 0.2   0.4   0.4    ? ? ?Results for orders placed or performed during the hospital encounter of 03/16/22 (from the past 48 hour(s))  ?hCG, serum, qualitative     Status: None  ? Collection Time: 03/16/22  5:51 AM   ?Result Value Ref Range  ? Preg, Serum NEGATIVE NEGATIVE  ?  Comment:        ?THE SENSITIVITY OF THIS ?METHODOLOGY IS >10 mIU/mL. ?Performed at [facility], [address] [place], [place] [address] ?  ? ?US Abdomen Limited RUQ (LIVER/GB) ? ?Result Date: 03/16/2022 ?CLINICAL DATA:  Gallstones. Intermittent right upper quadrant abdominal pain for 2 weeks, worse after eating. EXAM: ULTRASOUND ABDOMEN LIMITED RIGHT UPPER QUADRANT COMPARISON:  Right upper quadrant ultrasound and CT abdomen and pelvis 03/10/2022 FINDINGS: Gallbladder: A 14 mm shadowing gallstone is again noted within the fundus the gallbladder. Gallbladder wall is mildly thickened at 3.1 mm. Increased sludge is present within the gallbladder. No sonographic Percell Miller sign was elicited. Common bile duct: Diameter: 2.7 mm, within normal limits. Liver: No focal lesion identified. Within normal limits in parenchymal echogenicity. Portal vein is patent on color Doppler imaging with normal direction of blood flow towards the liver. Other: None. IMPRESSION: 1. Increased gallbladder sludge and gallbladder wall thickening in the presence of gallstones concerning for developing cholecystitis. 2. Stable appearance of calcified gallstone. Electronically Signed   By: San Morelle M.D.   On: 03/16/2022 09:40   ? ?Review of Systems  ?HENT:  Negative for ear discharge, ear pain, hearing loss and tinnitus.   ?Eyes:  Negative for photophobia and pain.  ?Respiratory:  Negative for cough and shortness of breath.   ?Cardiovascular:  Negative for chest pain.  ?Gastrointestinal:  Positive for abdominal distention, abdominal pain, nausea and vomiting.  ?Genitourinary:  Negative for dysuria, flank pain, frequency and urgency.  ?Musculoskeletal:  Negative for back pain, myalgias and neck pain.  ?Neurological:  Negative for dizziness and headaches.  ?Hematological:  Does not bruise/bleed easily.  ?Psychiatric/Behavioral:  The patient is not nervous/anxious.    ? ?Blood pressure 122/85, pulse 88, temperature 98 ?F (36.7 ?C), temperature source Oral, resp. rate 18, height _2  (1.727 m), weight 88.5 kg, last menstrual period 02/22/2022, SpO2 100 %, unknown if currently breastfeeding. ?Physical Exam  ?Constitutional:  WDWN in NAD, conversant, no obvious deformities; lying in bed comfortably ?Eyes:  Pupils equal, round; sclera anicteric; moist conjunctiva; no lid lag ?HENT:  Oral mucosa moist; good dentition  ?Neck:  No masses palpated, trachea midline; no thyromegaly ?Lungs:  CTA bilaterally; normal respiratory effort ?CV:  Regular rate and rhythm; no murmurs; extremities well-perfused with no  edema ?Abd:  +bowel sounds, soft, tender in the RUQ/ epigastrium; no palpable organomegaly; no palpable hernias ?Musc:  Unable to assess gait; no apparent clubbing or cyanosis in extremities ?Lymphatic:  No palpable cervical or axillary lymphadenopathy ?Skin:  Warm, dry; no sign of jaundice ?Psychiatric - alert and oriented x 4; calm mood and affect ? ?Assessment/Plan ?Acute calculus cholecystitis ? ?Admit for IV hydration/ IV antibiotics ?Plan laparoscopic cholecystectomy, likely for tomorrow morning.  The surgical procedure has been discussed with the patient.  Potential risks, benefits, alternative treatments, and expected outcomes have been explained.  All of the patient's questions at this time have been answered.  The likelihood of reaching the patient's treatment goal is good.  The patient understand the proposed surgical procedure and wishes to proceed. ? ? ?Maia Petties, MD ?03/16/2022, 11:39 AM ? ? ? ?

## 2022-03-16 NOTE — ED Notes (Signed)
[facility] in to transport to [facility] ED ?

## 2022-03-16 NOTE — ED Triage Notes (Signed)
Reports RUQ abdominal pain for the last two weeks.  Scheduled to have gallbladder out on Friday.  Reports unable to tolerate pain.  Was at [facility] earlier tonight.  Left due to long wait. ?

## 2022-03-16 NOTE — ED Notes (Signed)
Report given to [facility] charge nurse ?

## 2022-03-16 NOTE — ED Provider Notes (Signed)
Patient transported from [place] for RUQ [place] as they do not have [place] today. Originally seen by Dr. Florina Ou earlier today. ? ?Briefly: Patient with 2 weeks of RUQ pain diagnosed with cholelithiasis on 5/09 with cholecystectomy scheduled on 5/19 with Dr. Windell Moment at [facility] presented earlier today with worsening RUQ pain nausea and vomiting.  ? ?Plan: Labs without any acute findings, no leukocytosis or elevation in liver enzymes. Plan to reassess after RUQ [place] to determine dispo. ? ?RUQ US shows  ? ?1. Increased gallbladder sludge and gallbladder wall thickening in the presence of gallstones concerning for developing cholecystitis. ?2. Stable appearance of calcified gallstone. ? ?I have personally reviewed this imaging and agree with radiology interpretation.  ? ?Upon reassessment, patient continues to endorse significant RUQ pain with nausea. Denies any vomiting since this morning. Significant RUQ tenderness with positive Murphy sign noted on physical exam. Will reorder pain and nausea medication and consult general surgery for further evaluation and management. Patient understanding and amenable with plan. ? ?Discussed patient with general surgery who will come see the patient. ? ?General surgery Dr. Georgette Dover has seen the patient, plan to admit for IV hydration and antibiotics with laparoscopic cholecystectomy likely in the morning.  ?  ?Deborah Weber ?03/16/22 1205 ? ?  ?Deborah Boss, MD ?03/18/22 1030 ? ?

## 2022-03-17 ENCOUNTER — Encounter (HOSPITAL_COMMUNITY)
Admission: EM | Disposition: A | Discharge status: Home / Self Care | Payer: Self-pay | Source: Home / Self Care | Attending: Emergency Medicine

## 2022-03-17 ENCOUNTER — Encounter (HOSPITAL_COMMUNITY): Payer: Self-pay

## 2022-03-17 ENCOUNTER — Other Ambulatory Visit: Payer: Self-pay

## 2022-03-17 ENCOUNTER — Observation Stay (HOSPITAL_COMMUNITY): Payer: 59

## 2022-03-17 ENCOUNTER — Observation Stay (HOSPITAL_BASED_OUTPATIENT_CLINIC_OR_DEPARTMENT_OTHER): Payer: 59 | Admitting: Anesthesiology

## 2022-03-17 ENCOUNTER — Observation Stay (HOSPITAL_COMMUNITY): Payer: 59 | Admitting: Anesthesiology

## 2022-03-17 DIAGNOSIS — K8000 Calculus of gallbladder with acute cholecystitis without obstruction: Secondary | ICD-10-CM

## 2022-03-17 HISTORY — PX: CHOLECYSTECTOMY: SHX55

## 2022-03-17 LAB — HIV ANTIBODY (ROUTINE TESTING W REFLEX): HIV Screen 4th Generation wRfx: NONREACTIVE

## 2022-03-17 SURGERY — LAPAROSCOPIC CHOLECYSTECTOMY WITH INTRAOPERATIVE CHOLANGIOGRAM
Anesthesia: General

## 2022-03-17 MED ORDER — ESMOLOL HCL-SODIUM CHLORIDE 2000 MG/100ML IV SOLN
INTRAVENOUS | Status: DC | PRN
  Administered 2022-03-17 (×2): 10 mg via INTRAVENOUS
  Administered 2022-03-17: 20 mg via INTRAVENOUS

## 2022-03-17 MED ORDER — MIDAZOLAM HCL 2 MG/2ML IJ SOLN
INTRAMUSCULAR | Status: AC
  Filled 2022-03-17: qty 2

## 2022-03-17 MED ORDER — HYDROMORPHONE HCL 1 MG/ML IJ SOLN
0.2500 mg | INTRAMUSCULAR | Status: DC | PRN
  Administered 2022-03-17: 0.5 mg via INTRAVENOUS

## 2022-03-17 MED ORDER — SCOPOLAMINE 1 MG/3DAYS TD PT72
1.0000 | MEDICATED_PATCH | Freq: Once | TRANSDERMAL | Status: DC
  Administered 2022-03-17: 1.5 mg via TRANSDERMAL
  Filled 2022-03-17: qty 1

## 2022-03-17 MED ORDER — KETAMINE HCL 10 MG/ML IJ SOLN
INTRAMUSCULAR | Status: DC | PRN
Start: 2022-03-17 — End: 2022-03-17
  Administered 2022-03-17: 40 mg via INTRAVENOUS

## 2022-03-17 MED ORDER — CHLORHEXIDINE GLUCONATE 0.12 % MT SOLN
15.0000 mL | Freq: Once | OROMUCOSAL | Status: AC
  Administered 2022-03-17: 15 mL via OROMUCOSAL

## 2022-03-17 MED ORDER — MIDAZOLAM HCL 5 MG/5ML IJ SOLN
INTRAMUSCULAR | Status: DC | PRN
Start: 2022-03-17 — End: 2022-03-17
  Administered 2022-03-17: 2 mg via INTRAVENOUS

## 2022-03-17 MED ORDER — PROPOFOL 10 MG/ML IV BOLUS
INTRAVENOUS | Status: AC
Start: 2022-03-17 — End: ?
  Filled 2022-03-17: qty 20

## 2022-03-17 MED ORDER — ACETAMINOPHEN 10 MG/ML IV SOLN
INTRAVENOUS | Status: AC
  Filled 2022-03-17: qty 100

## 2022-03-17 MED ORDER — LACTATED RINGERS IV SOLN: INTRAVENOUS | Status: DC

## 2022-03-17 MED ORDER — AMISULPRIDE (ANTIEMETIC) 5 MG/2ML IV SOLN: 10.0000 mg | Freq: Once | INTRAVENOUS | Status: DC | PRN

## 2022-03-17 MED ORDER — KETAMINE HCL 50 MG/5ML IJ SOSY
PREFILLED_SYRINGE | INTRAMUSCULAR | Status: AC
  Filled 2022-03-17: qty 5

## 2022-03-17 MED ORDER — LACTATED RINGERS IR SOLN
Status: DC | PRN
  Administered 2022-03-17: 1000 mL

## 2022-03-17 MED ORDER — OXYCODONE HCL 5 MG PO TABS: 5.0000 mg | ORAL_TABLET | Freq: Once | ORAL | Status: DC | PRN

## 2022-03-17 MED ORDER — DEXAMETHASONE SODIUM PHOSPHATE 10 MG/ML IJ SOLN
INTRAMUSCULAR | Status: AC
  Filled 2022-03-17: qty 1

## 2022-03-17 MED ORDER — BUPIVACAINE-EPINEPHRINE (PF) 0.25% -1:200000 IJ SOLN
INTRAMUSCULAR | Status: AC
  Filled 2022-03-17: qty 30

## 2022-03-17 MED ORDER — LIDOCAINE HCL (PF) 2 % IJ SOLN
INTRAMUSCULAR | Status: AC
  Filled 2022-03-17: qty 5

## 2022-03-17 MED ORDER — PROPOFOL 10 MG/ML IV BOLUS
INTRAVENOUS | Status: DC | PRN
  Administered 2022-03-17: 200 mg via INTRAVENOUS

## 2022-03-17 MED ORDER — SODIUM CHLORIDE 0.9 % IV SOLN
INTRAVENOUS | Status: DC | PRN
  Administered 2022-03-17: 6 mL

## 2022-03-17 MED ORDER — HYDROMORPHONE HCL 1 MG/ML IJ SOLN
INTRAMUSCULAR | Status: AC
  Administered 2022-03-17: 0.5 mg via INTRAVENOUS
  Filled 2022-03-17: qty 2

## 2022-03-17 MED ORDER — ROCURONIUM BROMIDE 100 MG/10ML IV SOLN
INTRAVENOUS | Status: DC | PRN
  Administered 2022-03-17: 40 mg via INTRAVENOUS

## 2022-03-17 MED ORDER — ACETAMINOPHEN 10 MG/ML IV SOLN
INTRAVENOUS | Status: DC | PRN
  Administered 2022-03-17: 1000 mg via INTRAVENOUS

## 2022-03-17 MED ORDER — SUCCINYLCHOLINE CHLORIDE 200 MG/10ML IV SOSY
PREFILLED_SYRINGE | INTRAVENOUS | Status: DC | PRN
  Administered 2022-03-17: 140 mg via INTRAVENOUS

## 2022-03-17 MED ORDER — DEXAMETHASONE SODIUM PHOSPHATE 10 MG/ML IJ SOLN
INTRAMUSCULAR | Status: DC | PRN
Start: 2022-03-17 — End: 2022-03-17
  Administered 2022-03-17: 10 mg via INTRAVENOUS

## 2022-03-17 MED ORDER — PROPOFOL 10 MG/ML IV BOLUS
INTRAVENOUS | Status: AC
  Filled 2022-03-17: qty 20

## 2022-03-17 MED ORDER — ROCURONIUM BROMIDE 10 MG/ML (PF) SYRINGE
PREFILLED_SYRINGE | INTRAVENOUS | Status: AC
  Filled 2022-03-17: qty 10

## 2022-03-17 MED ORDER — ONDANSETRON HCL 4 MG/2ML IJ SOLN
INTRAMUSCULAR | Status: AC
  Filled 2022-03-17: qty 2

## 2022-03-17 MED ORDER — OXYCODONE HCL 5 MG/5ML PO SOLN: 5.0000 mg | Freq: Once | ORAL | Status: DC | PRN

## 2022-03-17 MED ORDER — ESMOLOL HCL 100 MG/10ML IV SOLN
INTRAVENOUS | Status: AC
  Filled 2022-03-17: qty 10

## 2022-03-17 MED ORDER — ONDANSETRON HCL 4 MG/2ML IJ SOLN: 4.0000 mg | Freq: Once | INTRAMUSCULAR | Status: DC | PRN

## 2022-03-17 MED ORDER — FENTANYL CITRATE (PF) 250 MCG/5ML IJ SOLN
INTRAMUSCULAR | Status: DC | PRN
  Administered 2022-03-17 (×5): 50 ug via INTRAVENOUS

## 2022-03-17 MED ORDER — SUGAMMADEX SODIUM 500 MG/5ML IV SOLN
INTRAVENOUS | Status: DC | PRN
  Administered 2022-03-17: 200 mg via INTRAVENOUS

## 2022-03-17 MED ORDER — BUPIVACAINE-EPINEPHRINE 0.25% -1:200000 IJ SOLN
INTRAMUSCULAR | Status: DC | PRN
  Administered 2022-03-17: 16 mL

## 2022-03-17 MED ORDER — FENTANYL CITRATE (PF) 250 MCG/5ML IJ SOLN
INTRAMUSCULAR | Status: AC
  Filled 2022-03-17: qty 5

## 2022-03-17 MED ORDER — ONDANSETRON HCL 4 MG/2ML IJ SOLN
INTRAMUSCULAR | Status: DC | PRN
  Administered 2022-03-17: 4 mg via INTRAVENOUS

## 2022-03-17 MED ORDER — LIDOCAINE HCL (CARDIAC) PF 100 MG/5ML IV SOSY
PREFILLED_SYRINGE | INTRAVENOUS | Status: DC | PRN
  Administered 2022-03-17: 60 mg via INTRAVENOUS

## 2022-03-17 MED ORDER — SUCCINYLCHOLINE CHLORIDE 200 MG/10ML IV SOSY
PREFILLED_SYRINGE | INTRAVENOUS | Status: AC
  Filled 2022-03-17: qty 10

## 2022-03-17 SURGICAL SUPPLY — 50 items
APL PRP STRL LF DISP 70% ISPRP (MISCELLANEOUS) ×1
APL SKNCLS STERI-STRIP NONHPOA (GAUZE/BANDAGES/DRESSINGS) ×1
APPLIER CLIP ROT 10 11.4 M/L (STAPLE) ×2
APR CLP MED LRG 11.4X10 (STAPLE) ×1
BAG COUNTER SPONGE SURGICOUNT (BAG) ×1 IMPLANT
BAG RETRIEVAL 10 (BASKET)
BAG SPEC RTRVL 10 TROC 200 (ENDOMECHANICALS) ×1
BAG SPNG CNTER NS LX DISP (BAG) ×1
BENZOIN TINCTURE PRP APPL 2/3 (GAUZE/BANDAGES/DRESSINGS) ×3 IMPLANT
CHLORAPREP W/TINT 26 (MISCELLANEOUS) ×3 IMPLANT
CLIP APPLIE ROT 10 11.4 M/L (STAPLE) ×2 IMPLANT
COVER MAYO STAND XLG (MISCELLANEOUS) ×3 IMPLANT
COVER SURGICAL LIGHT HANDLE (MISCELLANEOUS) ×3 IMPLANT
DRAPE C-ARM 42X120 X-RAY (DRAPES) ×3 IMPLANT
DRSG TEGADERM 2-3/8X2-3/4 SM (GAUZE/BANDAGES/DRESSINGS) ×9 IMPLANT
DRSG TEGADERM 4X4.75 (GAUZE/BANDAGES/DRESSINGS) ×3 IMPLANT
ELECT REM PT RETURN 15FT ADLT (MISCELLANEOUS) ×3 IMPLANT
GAUZE SPONGE 2X2 8PLY STRL LF (GAUZE/BANDAGES/DRESSINGS) IMPLANT
GLOVE BIO SURGEON STRL SZ7 (GLOVE) ×3 IMPLANT
GLOVE BIOGEL PI IND STRL 7.5 (GLOVE) ×2 IMPLANT
GLOVE BIOGEL PI INDICATOR 7.5 (GLOVE) ×1
GOWN STRL REUS W/ TWL LRG LVL3 (GOWN DISPOSABLE) ×2 IMPLANT
GOWN STRL REUS W/TWL LRG LVL3 (GOWN DISPOSABLE) ×2
IRRIG SUCT STRYKERFLOW 2 WTIP (MISCELLANEOUS) ×2
IRRIGATION SUCT STRKRFLW 2 WTP (MISCELLANEOUS) ×2 IMPLANT
KIT BASIN OR (CUSTOM PROCEDURE TRAY) ×3 IMPLANT
KIT TURNOVER KIT A (KITS) ×1 IMPLANT
NS IRRIG 1000ML POUR BTL (IV SOLUTION) ×3 IMPLANT
PENCIL SMOKE EVACUATOR (MISCELLANEOUS) IMPLANT
POUCH RETRIEVAL ECOSAC 10 (ENDOMECHANICALS) IMPLANT
POUCH RETRIEVAL ECOSAC 10MM (ENDOMECHANICALS) ×2
PROTECTOR NERVE ULNAR (MISCELLANEOUS) IMPLANT
SCISSORS LAP 5X35 DISP (ENDOMECHANICALS) ×3 IMPLANT
SET CHOLANGIOGRAPH MIX (MISCELLANEOUS) ×3 IMPLANT
SET TUBE SMOKE EVAC HIGH FLOW (TUBING) ×3 IMPLANT
SPIKE FLUID TRANSFER (MISCELLANEOUS) ×3 IMPLANT
SPONGE GAUZE 2X2 STER 10/PKG (GAUZE/BANDAGES/DRESSINGS)
STRIP CLOSURE SKIN 1/2X4 (GAUZE/BANDAGES/DRESSINGS) ×3 IMPLANT
SUT MNCRL AB 4-0 PS2 18 (SUTURE) ×3 IMPLANT
SYR 20ML LL LF (SYRINGE) IMPLANT
SYS BAG RETRIEVAL 10MM (BASKET)
SYSTEM BAG RETRIEVAL 10MM (BASKET) IMPLANT
TAPE CLOTH 4X10 WHT NS (GAUZE/BANDAGES/DRESSINGS) IMPLANT
TOWEL OR 17X26 10 PK STRL BLUE (TOWEL DISPOSABLE) ×3 IMPLANT
TOWEL OR NON WOVEN STRL DISP B (DISPOSABLE) ×3 IMPLANT
TRAY LAPAROSCOPIC (CUSTOM PROCEDURE TRAY) ×3 IMPLANT
TROCAR BALLN 12MMX100 BLUNT (TROCAR) ×3 IMPLANT
TROCAR BLADELESS OPT 5 100 (ENDOMECHANICALS) ×6 IMPLANT
TROCAR XCEL NON-BLD 11X100MML (ENDOMECHANICALS) ×3 IMPLANT
WATER STERILE IRR 1000ML POUR (IV SOLUTION) ×3 IMPLANT

## 2022-03-17 NOTE — Anesthesia Postprocedure Evaluation (Signed)
Anesthesia Post Note ? ?Patient: Deborah Weber ? ?Procedure(s) Performed: LAPAROSCOPIC CHOLECYSTECTOMY WITH INTRAOPERATIVE CHOLANGIOGRAM ? ?  ? ?Patient location during evaluation: PACU ?Anesthesia Type: General ?Level of consciousness: awake and alert and oriented ?Pain management: pain level controlled ?Vital Signs Assessment: post-procedure vital signs reviewed and stable ?Respiratory status: spontaneous breathing, nonlabored ventilation and respiratory function stable ?Cardiovascular status: blood pressure returned to baseline and stable ?Postop Assessment: no apparent nausea or vomiting ?Anesthetic complications: no ? ? ?No notable events documented. ? ?Last Vitals:  ?Vitals:  ? 03/17/22 1222 03/17/22 1325  ?BP: (!) 151/100 (!) 147/103  ?Pulse: 97 (!) 109  ?Resp: 18 16  ?Temp: 37.1 ?C 36.9 ?C  ?SpO2: 96% 100%  ?  ?Last Pain:  ?Vitals:  ? 03/17/22 1325  ?TempSrc: Oral  ?PainSc:   ? ? ?  ?  ?  ?  ?  ?  ? ?[name] A. ? ? ? ? ?

## 2022-03-17 NOTE — Anesthesia Preprocedure Evaluation (Signed)
Anesthesia Evaluation  ? ? ?Airway ?Mallampati: II ? ?TM Distance: >3 FB ?Neck ROM: Full ? ? ? Dental ?no notable dental hx. ?(+) Teeth Intact, Dental Advisory Given ?  ?Pulmonary ?former smoker,  ?  ?Pulmonary exam normal ?breath sounds clear to auscultation ? ? ? ? ? ? Cardiovascular ?negative cardio ROS ? ? ?Rhythm:Regular Rate:Tachycardia ? ? ?  ?Neuro/Psych ? Headaches, negative psych ROS  ? GI/Hepatic ?Neg liver ROS, IBS ?Cholelithiasis with acute cholecystitis ?  ?Endo/Other  ?negative endocrine ROS ? Renal/GU ?negative Renal ROS  ?negative genitourinary ?  ?Musculoskeletal ?negative musculoskeletal ROS ?(+)  ? Abdominal ?(+)  ?Abdomen: tender. ? ?  ?Peds ? Hematology ? ?(+) Blood dyscrasia, anemia ,   ?Anesthesia Other Findings ? ? Reproductive/Obstetrics ? ?  ? ? ? ? ? ? ? ? ? ? ? ? ? ?  ?  ? ? ? ? ? ? ? ? ?Anesthesia Physical ?Anesthesia Plan ? ?ASA: 2 ? ?Anesthesia Plan: General  ? ?Post-op Pain Management: Ofirmev IV (intra-op)*, Ketamine IV* and Dilaudid IV  ? ?Induction: Intravenous, Cricoid pressure planned and Rapid sequence ? ?PONV Risk Score and Plan: 4 or greater and Treatment may vary due to age or medical condition, Scopolamine patch - Pre-op, Midazolam, Ondansetron and Dexamethasone ? ?Airway Management Planned: Oral ETT ? ?Additional Equipment: None ? ?Intra-op Plan:  ? ?Post-operative Plan: Extubation in OR ? ?Informed Consent: I have reviewed the patients History and Physical, chart, labs and discussed the procedure including the risks, benefits and alternatives for the proposed anesthesia with the patient or authorized representative who has indicated his/her understanding and acceptance.  ? ? ? ?Dental advisory given ? ?Plan Discussed with: CRNA and Anesthesiologist ? ?Anesthesia Plan Comments:   ? ? ? ? ? ? ?Anesthesia Quick Evaluation ? ?

## 2022-03-17 NOTE — Transfer of Care (Signed)
Immediate Anesthesia Transfer of Care Note ? ?Patient: Deborah Weber ? ?Procedure(s) Performed: LAPAROSCOPIC CHOLECYSTECTOMY WITH INTRAOPERATIVE CHOLANGIOGRAM ? ?Patient Location: PACU ? ?Anesthesia Type:General ? ?Level of Consciousness: awake, oriented and drowsy ? ?Airway & Oxygen Therapy: Patient Spontanous Breathing and Patient connected to face mask oxygen ? ?Post-op Assessment: Report given to RN and Post -op Vital signs reviewed and stable ? ?Post vital signs: Reviewed and stable ? ?Last Vitals:  ?Vitals Value Taken Time  ?BP 132/80 03/17/22 1122  ?Temp    ?Pulse 113 03/17/22 1125  ?Resp 19 03/17/22 1125  ?SpO2 100 % 03/17/22 1125  ?Vitals shown include unvalidated device data. ? ?Last Pain:  ?Vitals:  ? 03/17/22 0906  ?TempSrc: Oral  ?PainSc: 0-No pain  ?   ? ?Patients Stated Pain Goal: 2 (03/17/22 0726) ? ?Complications: No notable events documented. ?

## 2022-03-17 NOTE — Anesthesia Procedure Notes (Signed)
Procedure Name: Intubation ?Date/Time: 03/17/2022 10:34 AM ?Performed by: Victoriano Lain, CRNA ?Pre-anesthesia Checklist: Patient identified, Emergency Drugs available, Suction available and Patient being monitored ?Patient Re-evaluated:Patient Re-evaluated prior to induction ?Oxygen Delivery Method: Circle system utilized ?Preoxygenation: Pre-oxygenation with 100% oxygen ?Induction Type: IV induction ?Ventilation: Mask ventilation without difficulty ?Laryngoscope Size: Sabra Heck and 2 ?Grade View: Grade I ?Tube type: Oral ?Tube size: 7.0 mm ?Number of attempts: 1 ?Airway Equipment and Method: Stylet and Oral airway ?Placement Confirmation: ETT inserted through vocal cords under direct vision, positive ETCO2 and breath sounds checked- equal and bilateral ?Secured at: 23 cm ?Tube secured with: Tape ?Dental Injury: Teeth and Oropharynx as per pre-operative assessment  ?Comments: DL x1 and intubation with RSI and cricoid pressure by Liane Comber, SRNA. +etCO2, + bilateral breath sounds, atraumatic. ? ? ? ? ?

## 2022-03-17 NOTE — Discharge Instructions (Signed)
[facility] ? ?Please arrive at least 30 min before your appointment to complete your check in paperwork.  If you are unable to arrive 30 min prior to your appointment time we may have to cancel or reschedule you. ?LAPAROSCOPIC SURGERY: POST OP INSTRUCTIONS ?Always review your discharge instruction sheet given to you by the facility where your surgery was performed. ?IF YOU HAVE DISABILITY OR FAMILY LEAVE FORMS, YOU MUST BRING THEM TO THE OFFICE FOR PROCESSING.   ?DO NOT GIVE THEM TO YOUR DOCTOR. ? ?PAIN CONTROL ? ?First take acetaminophen (Tylenol) AND/or ibuprofen (Advil) to control your pain after surgery.  Follow directions on package.  Taking acetaminophen (Tylenol) and/or ibuprofen (Advil) regularly after surgery will help to control your pain and lower the amount of prescription pain medication you may need.  You should not take more than 4,000 mg (4 grams) of acetaminophen (Tylenol) in 24 hours.  You should not take ibuprofen (Advil), aleve, motrin, naprosyn or other NSAIDS if you have a history of stomach ulcers or chronic kidney disease.  ?A prescription for pain medication may be given to you upon discharge.  Take your pain medication as prescribed, if you still have uncontrolled pain after taking acetaminophen (Tylenol) or ibuprofen (Advil). ?Use ice packs to help control pain. ?If you need a refill on your pain medication, please contact your pharmacy.  They will contact our office to request authorization. Prescriptions will not be filled after 5pm or on week-ends. ? ?HOME MEDICATIONS ?Take your usually prescribed medications unless otherwise directed. ? ?DIET ?You should follow a light diet the first few days after arrival home.  Be sure to include lots of fluids daily. Avoid fatty, fried foods.  ? ?CONSTIPATION ?It is common to experience some constipation after surgery and if you are taking pain medication.  Increasing fluid intake and taking a stool softener (such as Colace)  will usually help or prevent this problem from occurring.  A mild laxative (Milk of Magnesia or Miralax) should be taken according to package instructions if there are no bowel movements after 48 hours. ? ?WOUND/INCISION CARE ?Most patients will experience some swelling and bruising in the area of the incisions.  Ice packs will help.  Swelling and bruising can take several days to resolve.  ?Unless discharge instructions indicate otherwise, follow guidelines below  ?STERI-STRIPS - you may remove your outer bandages 48 hours after surgery, and you may shower at that time.  You have steri-strips (small skin tapes) in place directly over the incision.  These strips should be left on the skin for 7-10 days.   ?DERMABOND/SKIN GLUE - you may shower in 24 hours.  The glue will flake off over the next 2-3 weeks. ?Any sutures or staples will be removed at the office during your follow-up visit. ? ?ACTIVITIES ?You may resume regular (light) daily activities beginning the next day--such as daily self-care, walking, climbing stairs--gradually increasing activities as tolerated.  You may have sexual intercourse when it is comfortable.  Refrain from any heavy lifting or straining until approved by your doctor. ?You may drive when you are no longer taking prescription pain medication, you can comfortably wear a seatbelt, and you can safely maneuver your car and apply brakes. ? ?FOLLOW-UP ?You should see your doctor in the office for a follow-up appointment approximately 2-3 weeks after your surgery.  You should have been given your post-op/follow-up appointment when your surgery was scheduled.  If you did not receive a post-op/follow-up appointment, make sure   that you call for this appointment within a day or two after you arrive home to insure a convenient appointment time. ? ? ?WHEN TO CALL YOUR DOCTOR: ?Fever over 101.0 ?Inability to urinate ?Continued bleeding from incision. ?Increased pain, redness, or drainage from the  incision. ?Increasing abdominal pain ? ?The clinic staff is available to answer your questions during regular business hours.  Please don?t hesitate to call and ask to speak to one of the nurses for clinical concerns.  If you have a medical emergency, go to the nearest emergency room or call 911.  A surgeon from [facility] is always on call at the hospital. ?[address], [address], [place], [place]  [address] ? [address] [address], [place], [place]   [address] ?[phone] ? [phone] ? FAX [phone] ? ? ? ? ?Managing Your Pain After Surgery Without Opioids ? ? ? ?Thank you for participating in our program to help patients manage their pain after surgery without opioids. This is part of our effort to provide you with the best care possible, without exposing you or your family to the risk that opioids pose. ? ?What pain can I expect after surgery? ?You can expect to have some pain after surgery. This is normal. The pain is typically worse the day after surgery, and quickly begins to get better. ?Many studies have found that many patients are able to manage their pain after surgery with Over-the-Counter (OTC) medications such as Tylenol and Motrin. If you have a condition that does not allow you to take Tylenol or Motrin, notify your surgical team. ? ?How will I manage my pain? ?The best strategy for controlling your pain after surgery is around the clock pain control with Tylenol (acetaminophen) and Motrin (ibuprofen or Advil). Alternating these medications with each other allows you to maximize your pain control. In addition to Tylenol and Motrin, you can use heating pads or ice packs on your incisions to help reduce your pain. ? ?How will I alternate your regular strength over-the-counter pain medication? ?You will take a dose of pain medication every three hours. ?Start by taking 650 mg of Tylenol (2 pills of 325 mg) ?3 hours later take 600 mg of Motrin (3 pills of 200 mg) ?3 hours after  taking the Motrin take 650 mg of Tylenol ?3 hours after that take 600 mg of Motrin. ? ? ?- 1 - ? ?See example - if your first dose of Tylenol is at 12:00 PM ? ? ?12:00 PM Tylenol 650 mg (2 pills of 325 mg)  ?3:00 PM Motrin 600 mg (3 pills of 200 mg)  ?6:00 PM Tylenol 650 mg (2 pills of 325 mg)  ?9:00 PM Motrin 600 mg (3 pills of 200 mg)  ?Continue alternating every 3 hours  ? ?We recommend that you follow this schedule around-the-clock for at least 3 days after surgery, or until you feel that it is no longer needed. Use the table on the last page of this handout to keep track of the medications you are taking. ?Important: ?Do not take more than 3000mg of Tylenol or 3200mg of Motrin in a 24-hour period. ?Do not take ibuprofen/Motrin if you have a history of bleeding stomach ulcers, severe kidney disease, &/or actively taking a blood thinner ? ?What if I still have pain? ?If you have pain that is not controlled with the over-the-counter pain medications (Tylenol and Motrin or Advil) you might have what we call ?breakthrough? pain. You will receive a prescription   for a small amount of an opioid pain medication such as Oxycodone, Tramadol, or Tylenol with Codeine. Use these opioid pills in the first 24 hours after surgery if you have breakthrough pain. Do not take more than 1 pill every 4-6 hours. ? ?If you still have uncontrolled pain after using all opioid pills, don't hesitate to call our staff using the number provided. We will help make sure you are managing your pain in the best way possible, and if necessary, we can provide a prescription for additional pain medication. ? ? ?Day 1   ? ?Time  ?Name of Medication Number of pills taken  ?Amount of Acetaminophen  ?Pain Level  ? ?Comments  ?AM PM       ?AM PM       ?AM PM       ?AM PM       ?AM PM       ?AM PM       ?AM PM       ?AM PM       ?Total Daily amount of Acetaminophen ?Do not take more than  3,000 mg per day    ? ? ?Day 2   ? ?Time  ?Name of Medication  Number of pills ?taken  ?Amount of Acetaminophen  ?Pain Level  ? ?Comments  ?AM PM       ?AM PM       ?AM PM       ?AM PM       ?AM PM       ?AM PM       ?AM PM       ?AM PM       ?Total Daily amount of Acetaminop

## 2022-03-17 NOTE — Op Note (Signed)
Laparoscopic Cholecystectomy with IOC Procedure Note ? ?Indications: This is a 26 year old female who presents with a few weeks of RUQ/ epigastric pain radiating through to her back, associated with nausea & vomiting.  Exacerbated by eating.  No diarrhea.  She was diagnosed with gallstones and was referred by [facility] in [place].  She is scheduled for robotic cholecystectomy later this week.  However, her symptoms have become more severe and she is unable to keep down any PO's.  She presented to [facility] ED for evaluation.  Repeat US showed mild wall thickening.  LFT's WNL.  ? ?Pre-operative Diagnosis: Calculus of gallbladder with acute cholecystitis, without mention of obstruction ? ?Post-operative Diagnosis: Same ? ?Surgeon: Maia Petties  ? ?Assistants: None ? ?Anesthesia: General endotracheal anesthesia ? ?ASA Class: 2 ? ?Procedure Details  ?The patient was seen again in the Holding Room. The risks, benefits, complications, treatment options, and expected outcomes were discussed with the patient. The possibilities of reaction to medication, pulmonary aspiration, perforation of viscus, bleeding, recurrent infection, finding a normal gallbladder, the need for additional procedures, failure to diagnose a condition, the possible need to convert to an open procedure, and creating a complication requiring transfusion or operation were discussed with the patient. The likelihood of improving the patient's symptoms with return to their baseline status is good.  The patient and/or family concurred with the proposed plan, giving informed consent. The site of surgery properly noted. The patient was taken to Operating Room, identified as Deborah Weber and the procedure verified as Laparoscopic Cholecystectomy with Intraoperative Cholangiogram. A Time Out was held and the above information confirmed. ? ?Prior to the induction of general anesthesia, antibiotic prophylaxis was administered. General endotracheal  anesthesia was then administered and tolerated well. After the induction, the abdomen was prepped with Chloraprep and draped in the sterile fashion. The patient was positioned in the supine position. ? ?Local anesthetic agent was injected into the skin near the umbilicus and an incision made. We dissected down to the abdominal fascia with blunt dissection.  The fascia was incised vertically and we entered the peritoneal cavity bluntly.  A pursestring suture of 0-Vicryl was placed around the fascial opening.  The Hasson cannula was inserted and secured with the stay suture.  Pneumoperitoneum was then created with CO2 and tolerated well without any adverse changes in the patient's vital signs. An 11-mm port was placed in the subxiphoid position.  Two 5-mm ports were placed in the right upper quadrant. All skin incisions were infiltrated with a local anesthetic agent before making the incision and placing the trocars.  ? ?We positioned the patient in reverse Trendelenburg, tilted slightly to the patient's left.  The gallbladder was identified, the fundus grasped and retracted cephalad.  The gallbladder is quite thickened and edematous with some omental adhesions.  Adhesions were lysed bluntly and with the electrocautery where indicated, taking care not to injure any adjacent organs or viscus. The infundibulum was grasped and retracted laterally, exposing the peritoneum overlying the triangle of Calot. This was then divided and exposed in a blunt fashion. A critical view of the cystic duct and cystic artery was obtained.  The cystic duct was clearly identified and bluntly dissected circumferentially. The cystic duct was ligated with a clip distally.   An incision was made in the cystic duct and the [facility] cholangiogram catheter introduced. The catheter was secured using a clip. A cholangiogram was then obtained which showed good visualization of the distal and proximal biliary tree  with no sign of filling defects or  obstruction.  Contrast flowed easily into the duodenum. The catheter was then removed.  ? ?The cystic duct was then ligated with clips and divided. The cystic artery was identified, dissected free, ligated with clips and divided as well.  ? ?The gallbladder was dissected from the liver bed in retrograde fashion with the electrocautery. The gallbladder was removed and placed in an Eco sac. The liver bed was irrigated and inspected. Hemostasis was achieved with the electrocautery. Copious irrigation was utilized and was repeatedly aspirated until clear.  The gallbladder and Eco sac were then removed through the umbilical port site.  The pursestring suture was used to close the umbilical fascia.   ? ?We again inspected the right upper quadrant for hemostasis.  Pneumoperitoneum was released as we removed the trocars.  4-0 Monocryl was used to close the skin.   Benzoin, steri-strips, and clean dressings were applied. The patient was then extubated and brought to the recovery room in stable condition. Instrument, sponge, and needle counts were correct at closure and at the conclusion of the case.  ? ?Findings: ?Cholecystitis with Cholelithiasis ? ?Estimated Blood Loss: Minimal ?        ?Drains: none ?        ?Specimens: Gallbladder     ?      ?Complications: None; patient tolerated the procedure well. ?        ?Disposition: PACU - hemodynamically stable. ?        ?Condition: stable ? ?[name]. [name], MD, FACS ?[facility]  ?General Surgery ? ? ?03/17/2022 ?11:13 AM ? ? ? ?

## 2022-03-17 NOTE — Progress Notes (Signed)
Day of Surgery  ? ?Subjective/Chief Complaint: ?Still with some RUQ tenderness ? ? ?Objective: ?Vital signs in last 24 hours: ?Temp:  [97.8 ?F (36.6 ?C)-99 ?F (37.2 ?C)] 98.5 ?F (36.9 ?C) (05/16 0550) ?Pulse Rate:  [70-96] 70 (05/16 0550) ?Resp:  [16-20] 18 (05/16 0550) ?BP: (109-132)/(69-88) 112/70 (05/16 0550) ?SpO2:  [99 %-100 %] 100 % (05/16 0550) ?Last BM Date : 03/15/22 ? ?Intake/Output from previous day: ?05/15 0701 - 05/16 0700 ?In: 2713.2 [P.O.:240; I.V.:1479.4; IV Piggyback:993.8] ?Out: 800 [Urine:800] ?Intake/Output this shift: ?Total I/O ?In: 1155 [I.V.:1155] ?Out: 800 [Urine:800] ? ?WDWN in NAD ?Abd - soft, tender in RUQ/ epigastrium ? ?Lab Results:  ?Recent Labs  ?  03/16/22 ?0422  ?WBC 7.2  ?HGB 11.9*  ?HCT 35.2*  ?PLT 261  ? ?BMET ?Recent Labs  ?  03/16/22 ?0422  ?NA 135  ?K 4.3  ?CL 103  ?CO2 25  ?GLUCOSE 109*  ?BUN 8  ?CREATININE 0.70  ?CALCIUM 9.5  ? ? ?  Latest Ref Rng & Units 03/16/2022  ?  4:22 AM 03/10/2022  ? 10:38 AM 04/16/2018  ?  9:41 AM  ?Hepatic Function  ?Total Protein 6.5 - 8.1 g/dL 7.4   7.8   7.5    ?Albumin 3.5 - 5.0 g/dL 3.7   3.9   3.9    ?AST 15 - 41 U/L 22   17   16     ?ALT 0 - 44 U/L 30   21   18     ?Alk Phosphatase 38 - 126 U/L 53   54   51    ?Total Bilirubin 0.3 - 1.2 mg/dL 0.2   0.4   0.4    ? ? ? ?Studies/Results: ?US Abdomen Limited RUQ (LIVER/GB) ? ?Result Date: 03/16/2022 ?CLINICAL DATA:  Gallstones. Intermittent right upper quadrant abdominal pain for 2 weeks, worse after eating. EXAM: ULTRASOUND ABDOMEN LIMITED RIGHT UPPER QUADRANT COMPARISON:  Right upper quadrant ultrasound and CT abdomen and pelvis 03/10/2022 FINDINGS: Gallbladder: A 14 mm shadowing gallstone is again noted within the fundus the gallbladder. Gallbladder wall is mildly thickened at 3.1 mm. Increased sludge is present within the gallbladder. No sonographic Percell Miller sign was elicited. Common bile duct: Diameter: 2.7 mm, within normal limits. Liver: No focal lesion identified. Within normal limits in  parenchymal echogenicity. Portal vein is patent on color Doppler imaging with normal direction of blood flow towards the liver. Other: None. IMPRESSION: 1. Increased gallbladder sludge and gallbladder wall thickening in the presence of gallstones concerning for developing cholecystitis. 2. Stable appearance of calcified gallstone. Electronically Signed   By: San Morelle M.D.   On: 03/16/2022 09:40   ? ?Anti-infectives: ?Anti-infectives (From admission, onward)  ? ? Start     Dose/Rate Route Frequency Ordered Stop  ? 03/16/22 1600  cefTRIAXone (ROCEPHIN) 2 g in sodium chloride 0.9 % 100 mL IVPB       ? 2 g ?200 mL/hr over 30 Minutes Intravenous Every 24 hours 03/16/22 1337 03/23/22 1559  ? ?  ? ? ?Assessment/Plan: ?Acute cholecystitis ? ?Plan laparoscopic cholecystectomy with intraoperative cholangiogram today. The surgical procedure has been discussed with the patient.  Potential risks, benefits, alternative treatments, and expected outcomes have been explained.  All of the patient's questions at this time have been answered.  The likelihood of reaching the patient's treatment goal is good.  The patient understand the proposed surgical procedure and wishes to proceed. ? ? LOS: 0 days  ? ? ?Deborah Weber ?03/17/2022 ? ?

## 2022-03-18 ENCOUNTER — Encounter (HOSPITAL_COMMUNITY): Payer: Self-pay | Admitting: Surgery

## 2022-03-18 ENCOUNTER — Inpatient Hospital Stay: Admit: 2022-03-18 | Payer: 59

## 2022-03-18 LAB — COMPREHENSIVE METABOLIC PANEL
ALT: 58 U/L — ABNORMAL HIGH (ref 0–44)
AST: 43 U/L — ABNORMAL HIGH (ref 15–41)
Albumin: 3.4 g/dL — ABNORMAL LOW (ref 3.5–5.0)
Alkaline Phosphatase: 48 U/L (ref 38–126)
Anion gap: 4 — ABNORMAL LOW (ref 5–15)
BUN: 8 mg/dL (ref 6–20)
CO2: 27 mmol/L (ref 22–32)
Calcium: 8.5 mg/dL — ABNORMAL LOW (ref 8.9–10.3)
Chloride: 110 mmol/L (ref 98–111)
Creatinine, Ser: 0.83 mg/dL (ref 0.44–1.00)
GFR, Estimated: >60 mL/min (ref >60)
Glucose, Bld: 91 mg/dL (ref 70–99)
Potassium: 3.7 mmol/L (ref 3.5–5.1)
Sodium: 141 mmol/L (ref 135–145)
Total Bilirubin: 0.5 mg/dL (ref 0.3–1.2)
Total Protein: 7 g/dL (ref 6.5–8.1)

## 2022-03-18 MED ORDER — LIDOCAINE 5 % EX PTCH
1.0000 | MEDICATED_PATCH | CUTANEOUS | Status: DC
  Administered 2022-03-18: 1 via TRANSDERMAL
  Filled 2022-03-18 (×2): qty 1

## 2022-03-18 MED ORDER — ACETAMINOPHEN 325 MG PO TABS
650.0000 mg | ORAL_TABLET | Freq: Three times a day (TID) | ORAL | Status: DC
  Filled 2022-03-18 (×3): qty 2

## 2022-03-18 MED ORDER — METHOCARBAMOL 500 MG PO TABS
750.0000 mg | ORAL_TABLET | Freq: Four times a day (QID) | ORAL | Status: DC | PRN
  Administered 2022-03-18 – 2022-03-19 (×3): 750 mg via ORAL
  Filled 2022-03-18 (×3): qty 2

## 2022-03-18 MED ORDER — HYDROCODONE-ACETAMINOPHEN 5-325 MG PO TABS
1.0000 | ORAL_TABLET | ORAL | Status: DC | PRN
  Administered 2022-03-18 – 2022-03-19 (×5): 2 via ORAL
  Filled 2022-03-18 (×5): qty 2

## 2022-03-18 NOTE — Progress Notes (Signed)
[facility] ?Progress Note ? ?1 Day Post-Op  ?Subjective: ?CC:  ?Reports a lot of abdominal soreness, worse with movement. Cc this AM is nausea and vomiting x2, before breakfast. Does not feel like this is related to medication. Voiding. Denies flatus or BM yet.  ? ?Says she lives at home with her 63 month old son, works from home, has a lot of family support.  ? ?Objective: ?Vital signs in last 24 hours: ?Temp:  [97.7 ?F (36.5 ?C)-99 ?F (37.2 ?C)] 97.7 ?F (36.5 ?C) (05/17 0825) ?Pulse Rate:  [70-118] 86 (05/17 0825) ?Resp:  [13-18] 16 (05/17 0825) ?BP: (113-151)/(72-103) 130/83 (05/17 0825) ?SpO2:  [95 %-100 %] 100 % (05/17 0825) ?Last BM Date : 03/15/22 ? ?Intake/Output from previous day: ?05/16 0701 - 05/17 0700 ?In: 3790 [P.O.:1140; I.V.:2216; IV Piggyback:200] ?Out: 6660 [WIOXB:3532; Blood:10] ?Intake/Output this shift: ?No intake/output data recorded. ? ?PE: ?Gen:  Alert, NAD, pleasant ?Card:  Regular rate and rhythm, pedal pulses 2+ BL ?Pulm:  Normal effort, clear to auscultation bilaterally ?Abd: Soft,  appropriately tender, no HSM, incisions C/D/I ?Skin: warm and dry, no rashes  ?Psych: A&Ox3  ? ?Lab Results:  ?Recent Labs  ?  03/16/22 ?0422  ?WBC 7.2  ?HGB 11.9*  ?HCT 35.2*  ?PLT 261  ? ?BMET ?Recent Labs  ?  03/16/22 ?0422  ?NA 135  ?K 4.3  ?CL 103  ?CO2 25  ?GLUCOSE 109*  ?BUN 8  ?CREATININE 0.70  ?CALCIUM 9.5  ? ?PT/INR ?No results for input(s): LABPROT, INR in the last 72 hours. ?CMP  ?   ?Component Value Date/Time  ? NA 135 03/16/2022 0422  ? K 4.3 03/16/2022 0422  ? CL 103 03/16/2022 0422  ? CO2 25 03/16/2022 0422  ? GLUCOSE 109 (H) 03/16/2022 0422  ? BUN 8 03/16/2022 0422  ? CREATININE 0.70 03/16/2022 0422  ? CALCIUM 9.5 03/16/2022 0422  ? PROT 7.4 03/16/2022 0422  ? ALBUMIN 3.7 03/16/2022 0422  ? AST 22 03/16/2022 0422  ? ALT 30 03/16/2022 0422  ? ALKPHOS 53 03/16/2022 0422  ? BILITOT 0.2 (L) 03/16/2022 0422  ? GFRNONAA >60 03/16/2022 0422  ? GFRAA >60 04/16/2018 0941  ? ?Lipase  ?    ?Component Value Date/Time  ? LIPASE 29 03/16/2022 0422  ? ? ? ? ? ?Studies/Results: ?DG Cholangiogram Operative ? ?Result Date: 03/17/2022 ?CLINICAL DATA:  Intraoperative cholangiogram EXAM: INTRAOPERATIVE CHOLANGIOGRAM TECHNIQUE: Cholangiographic images from the C-arm fluoroscopic device were submitted for interpretation post-operatively. Please see the procedural report for the amount of contrast and the fluoroscopy time utilized. FLUOROSCOPY: Refer to separate report COMPARISON:  None Available. FINDINGS: A single fluoroscopic cine loop is submitted for review taken during intraoperative cholangiogram. Images demonstrate surgical instrumentation and clips overlying the gallbladder fossa. Injection of contrast material through the cystic duct remnant opacifies the intrahepatic and extrahepatic biliary tree. The bile ducts are normal in caliber and contour. No obvious filling defect identified. There is brisk passage of contrast material into the small bowel through the ampulla. IMPRESSION: Intraoperative cholangiogram as described. Refer to operative report for full details. Electronically Signed   By: Albin Felling M.D.   On: 03/17/2022 10:58  ? ?US Abdomen Limited RUQ (LIVER/GB) ? ?Result Date: 03/16/2022 ?CLINICAL DATA:  Gallstones. Intermittent right upper quadrant abdominal pain for 2 weeks, worse after eating. EXAM: ULTRASOUND ABDOMEN LIMITED RIGHT UPPER QUADRANT COMPARISON:  Right upper quadrant ultrasound and CT abdomen and pelvis 03/10/2022 FINDINGS: Gallbladder: A 14 mm shadowing gallstone is again noted within  the fundus the gallbladder. Gallbladder wall is mildly thickened at 3.1 mm. Increased sludge is present within the gallbladder. No sonographic Percell Miller sign was elicited. Common bile duct: Diameter: 2.7 mm, within normal limits. Liver: No focal lesion identified. Within normal limits in parenchymal echogenicity. Portal vein is patent on color Doppler imaging with normal direction of blood flow  towards the liver. Other: None. IMPRESSION: 1. Increased gallbladder sludge and gallbladder wall thickening in the presence of gallstones concerning for developing cholecystitis. 2. Stable appearance of calcified gallstone. Electronically Signed   By: San Morelle M.D.   On: 03/16/2022 09:40   ? ?Anti-infectives: ?Anti-infectives (From admission, onward)  ? ? Start     Dose/Rate Route Frequency Ordered Stop  ? 03/16/22 1600  cefTRIAXone (ROCEPHIN) 2 g in sodium chloride 0.9 % 100 mL IVPB       ? 2 g ?200 mL/hr over 30 Minutes Intravenous Every 24 hours 03/16/22 1337 03/23/22 1559  ? ?  ? ? ? ?Assessment/Plan ? Acute Calculous Cholecystitis ?POD#1 s/p laparoscopic cholecystectomy 03/17/22 Dr. Georgette Dover ?- IOC negative for choledocholithiasis  ?- afebrile, VSS  ?- nausea/vomiting this AM, antiemetics PRN and monitor ?- add robaxin for pain control since she cannot take NSAIDs.  ?- possible PM discharge if pain controlled, tolerating PO, mobilizing ? LOS: 0 days  ? ?I reviewed nursing notes, last 24 h vitals and pain scores, last 48 h intake and output, last 24 h labs and trends, and last 24 h imaging results. ? ? ? ?Obie Dredge, PA-C ?[facility] ?Please see Amion for pager number during day hours 7:00am-4:30pm ? ? ? ? ? ? ?

## 2022-03-18 NOTE — Progress Notes (Signed)
Transition of Care (TOC) Screening Note ? ?Patient Details  ?Name: Deborah Weber ?Date of Birth: August 20, 1996 ? ?Transition of Care (TOC) CM/SW Contact:    ?Sherie Don, LCSW ?Phone Number: ?03/18/2022, 11:20 AM ? ?Transition of Care Department [facility]) has reviewed patient and no TOC needs have been identified at this time. We will continue to monitor patient advancement through interdisciplinary progression rounds. If new patient transition needs arise, please place a TOC consult. ?

## 2022-03-19 LAB — SURGICAL PATHOLOGY

## 2022-03-19 MED ORDER — LIDOCAINE 5 % EX PTCH
1.0000 | MEDICATED_PATCH | CUTANEOUS | 0 refills | Status: AC
Start: 2022-03-19 — End: ?

## 2022-03-19 MED ORDER — ACETAMINOPHEN 325 MG PO TABS: 650.0000 mg | ORAL_TABLET | Freq: Three times a day (TID) | ORAL | Status: AC

## 2022-03-19 MED ORDER — HYDROCODONE-ACETAMINOPHEN 5-325 MG PO TABS
1.0000 | ORAL_TABLET | Freq: Four times a day (QID) | ORAL | 0 refills | Status: AC | PRN
Start: 2022-03-19 — End: ?

## 2022-03-19 NOTE — Discharge Summary (Signed)
[facility] Discharge Summary   Patient ID: Deborah Weber MRN: [id number] DOB/AGE: [birth year] 26 y.o.  Admit date: 03/16/2022 Discharge date: 03/19/2022  Admitting Diagnosis: cholecystitis  Discharge Diagnosis Patient Active Problem List   Diagnosis Date Noted   Acute cholecystitis due to biliary calculus 03/16/2022   Postpartum care following cesarean delivery 06/28/2021   Breech presentation, single or unspecified fetus 06/28/2021   Vaginal bleeding in pregnancy, first trimester 12/05/2020   Pelvic pain 11/27/2017   dysmenorrhea 10/31/2017   History of breast lump/mass excision 07/25/2013   Fibroadenoma of breast 07/25/2013    Consultants None   Imaging: DG Cholangiogram Operative  Result Date: 03/17/2022 CLINICAL DATA:  Intraoperative cholangiogram EXAM: INTRAOPERATIVE CHOLANGIOGRAM TECHNIQUE: Cholangiographic images from the C-arm fluoroscopic device were submitted for interpretation post-operatively. Please see the procedural report for the amount of contrast and the fluoroscopy time utilized. FLUOROSCOPY: Refer to separate report COMPARISON:  None Available. FINDINGS: A single fluoroscopic cine loop is submitted for review taken during intraoperative cholangiogram. Images demonstrate surgical instrumentation and clips overlying the gallbladder fossa. Injection of contrast material through the cystic duct remnant opacifies the intrahepatic and extrahepatic biliary tree. The bile ducts are normal in caliber and contour. No obvious filling defect identified. There is brisk passage of contrast material into the small bowel through the ampulla. IMPRESSION: Intraoperative cholangiogram as described. Refer to operative report for full details. Electronically Signed   By: Albin Felling M.D.   On: 03/17/2022 10:58    Procedures Dr. Donnie Mesa (03/17/22 - Laparoscopic Cholecystectomy with [place] Hospital Course:  26 y/o F w known history of symptomatic  chollithiasis who presented to [facility] with severe RUQ pain, nausea, vomiting.  Workup showed cholecystitis.  Patient was admitted and underwent procedure listed above.  Tolerated procedure well and was transferred to the floor.  Diet was advanced as tolerated.  She did have some mild post-operative nausea and two episodes of vomiting that improved with adjustment in per pain medication. On POD#2, the patient was voiding well, tolerating diet, ambulating well, pain well controlled, vital signs stable, incisions c/d/i and felt stable for discharge home.  Patient will follow up in our office in 2-3 weeks and knows to call with questions or concerns.  She will call to confirm appointment date/time.    Physical Exam: General:  Alert, NAD, pleasant, comfortable Abd:  Soft, ND, mild tenderness, incisions C/D/I w/ Tegaderm dressings in place  Allergies as of 03/19/2022       Reactions   Nsaids Other (See Comments)   History of gastritis with hemorrhage   Other Swelling   Gain brand cleaning- dish detergent    Ibuprofen Nausea Only   gastritis   Ammonium-containing Compounds Rash        Medication List     TAKE these medications    acetaminophen 325 MG tablet Commonly known as: TYLENOL Take 2 tablets (650 mg total) by mouth every 8 (eight) hours.   ASHWAGANDHA PO Take 1 tablet by mouth at bedtime.   EPINEPHrine 0.3 mg/0.3 mL Soaj injection Commonly known as: EPI-PEN Inject 0.3 mg into the muscle as needed for anaphylaxis.   etonogestrel 68 MG Impl implant Commonly known as: NEXPLANON 68 mg by Subdermal route once. Implanted October 2022   HYDROcodone-acetaminophen 5-325 MG tablet Commonly known as: NORCO/VICODIN Take 1 tablet by mouth every 6 (six) hours as needed for moderate pain or severe pain.   lidocaine 5 % Commonly known as: LIDODERM Place 1 patch onto the  skin daily. Remove & Discard patch within 12 hours or as directed by MD   loratadine 10 MG tablet Commonly known as:  CLARITIN Take 10 mg by mouth every morning.   Mylanta Maximum Strength 400-400-40 MG/5ML suspension Generic drug: alum & mag hydroxide-simeth Take 15 mLs by mouth every 6 (six) hours as needed for indigestion.   pantoprazole 20 MG tablet Commonly known as: PROTONIX Take 1 tablet (20 mg total) by mouth daily.   sucralfate 1 g tablet Commonly known as: Carafate Take 1 tablet (1 g total) by mouth 4 (four) times daily -  with meals and at bedtime for 14 days.   valACYclovir 500 MG tablet Commonly known as: VALTREX Take 500 mg by mouth daily as needed (cold sores).          Follow-up Information     [facility] [place] Follow up on 04/07/2022.   Specialty: General Surgery Why: 8:30am, arrive by 8:00am for paperwork and check in process Contact information: [place] [address] [phone]                 Signed: Obie Dredge, [facility] 03/19/2022, 8:43 AM

## 2022-03-19 NOTE — Progress Notes (Signed)
Reviewed written d/c instructions w pt and all questions answered. She verbalized understanding. D/C via w/c w all belongings in stable condition. 

## 2022-03-20 ENCOUNTER — Encounter: Admission: RE | Payer: Self-pay | Source: Home / Self Care

## 2022-03-20 ENCOUNTER — Ambulatory Visit: Admission: RE | Admit: 2022-03-20 | Payer: 59 | Source: Home / Self Care | Admitting: General Surgery

## 2022-03-20 SURGERY — CHOLECYSTECTOMY, ROBOT-ASSISTED, LAPAROSCOPIC
Anesthesia: General | Site: Abdomen

## 2022-03-24 LAB — I-STAT BETA HCG BLOOD, ED (MC, WL, AP ONLY): I-stat hCG, quantitative: <5 m[IU]/mL (ref <5)

## 2022-06-01 ENCOUNTER — Emergency Department (HOSPITAL_BASED_OUTPATIENT_CLINIC_OR_DEPARTMENT_OTHER)
Admission: EM | Admit: 2022-06-01 | Discharge: 2022-06-01 | Disposition: A | Discharge status: Home / Self Care | Payer: 59 | Attending: Emergency Medicine | Admitting: Emergency Medicine

## 2022-06-01 ENCOUNTER — Encounter (HOSPITAL_BASED_OUTPATIENT_CLINIC_OR_DEPARTMENT_OTHER): Payer: Self-pay | Admitting: Emergency Medicine

## 2022-06-01 DIAGNOSIS — N939 Abnormal uterine and vaginal bleeding, unspecified: Secondary | ICD-10-CM | POA: Insufficient documentation

## 2022-06-01 DIAGNOSIS — Z5321 Procedure and treatment not carried out due to patient leaving prior to being seen by health care provider: Secondary | ICD-10-CM | POA: Insufficient documentation

## 2022-06-01 LAB — CBC WITH DIFFERENTIAL/PLATELET
Abs Immature Granulocytes: 0.01 10*3/uL (ref 0.00–0.07)
Basophils Absolute: 0 10*3/uL (ref 0.0–0.1)
Basophils Relative: 0 %
Eosinophils Absolute: 0.1 10*3/uL (ref 0.0–0.5)
Eosinophils Relative: 2 %
HCT: 34.6 % — ABNORMAL LOW (ref 36.0–46.0)
Hemoglobin: 11.9 g/dL — ABNORMAL LOW (ref 12.0–15.0)
Immature Granulocytes: 0 %
Lymphocytes Relative: 42 %
Lymphs Abs: 2.8 10*3/uL (ref 0.7–4.0)
MCH: 24.2 pg — ABNORMAL LOW (ref 26.0–34.0)
MCHC: 34.4 g/dL (ref 30.0–36.0)
MCV: 70.3 fL — ABNORMAL LOW (ref 80.0–100.0)
Monocytes Absolute: 0.5 10*3/uL (ref 0.1–1.0)
Monocytes Relative: 7 %
Neutro Abs: 3.3 10*3/uL (ref 1.7–7.7)
Neutrophils Relative %: 49 %
Platelets: 304 10*3/uL (ref 150–400)
RBC: 4.92 MIL/uL (ref 3.87–5.11)
RDW: 15.9 % — ABNORMAL HIGH (ref 11.5–15.5)
WBC: 6.6 10*3/uL (ref 4.0–10.5)
nRBC: 0 % (ref 0.0–0.2)

## 2022-06-01 LAB — BASIC METABOLIC PANEL
Anion gap: 5 (ref 5–15)
BUN: 10 mg/dL (ref 6–20)
CO2: 26 mmol/L (ref 22–32)
Calcium: 8.9 mg/dL (ref 8.9–10.3)
Chloride: 106 mmol/L (ref 98–111)
Creatinine, Ser: 0.69 mg/dL (ref 0.44–1.00)
GFR, Estimated: >60 mL/min (ref >60)
Glucose, Bld: 77 mg/dL (ref 70–99)
Potassium: 3.8 mmol/L (ref 3.5–5.1)
Sodium: 137 mmol/L (ref 135–145)

## 2022-06-01 LAB — PREGNANCY, URINE: Preg Test, Ur: NEGATIVE

## 2022-06-01 LAB — HCG, QUANTITATIVE, PREGNANCY: hCG, Beta Chain, Quant, S: <1 m[IU]/mL (ref <5)

## 2022-06-01 NOTE — ED Triage Notes (Signed)
Pt states Heavy bleeding w/clots. X 1 day. Over night pad soaked in a hour.

## 2023-06-14 ENCOUNTER — Other Ambulatory Visit: Payer: Self-pay | Admitting: Pediatrics

## 2023-06-14 DIAGNOSIS — E040 Nontoxic diffuse goiter: Secondary | ICD-10-CM

## 2023-06-14 DIAGNOSIS — R002 Palpitations: Secondary | ICD-10-CM

## 2023-06-18 ENCOUNTER — Ambulatory Visit
Admission: RE | Admit: 2023-06-18 | Discharge: 2023-06-18 | Disposition: A | Discharge status: Home / Self Care | Payer: 59 | Source: Ambulatory Visit | Attending: Pediatrics | Admitting: Pediatrics

## 2023-06-18 DIAGNOSIS — R002 Palpitations: Secondary | ICD-10-CM | POA: Diagnosis present

## 2023-06-18 DIAGNOSIS — E040 Nontoxic diffuse goiter: Secondary | ICD-10-CM | POA: Diagnosis not present

## 2023-07-29 ENCOUNTER — Ambulatory Visit
Admission: RE | Admit: 2023-07-29 | Discharge: 2023-07-29 | Disposition: A | Discharge status: Home / Self Care | Payer: 59 | Source: Ambulatory Visit | Attending: Family Medicine | Admitting: Family Medicine

## 2023-07-29 ENCOUNTER — Other Ambulatory Visit: Payer: Self-pay | Admitting: Family Medicine

## 2023-07-29 DIAGNOSIS — R053 Chronic cough: Secondary | ICD-10-CM

## 2023-10-20 IMAGING — US US ABDOMEN LIMITED
1 series · 14 of 25 positions shown · non-contrast
Comparison: Right upper quadrant ultrasound and CT abdomen and
pelvis 03/10/2022

CLINICAL DATA: Gallstones. Intermittent right upper quadrant
abdominal pain for 2 weeks, worse after eating.

EXAM:
ULTRASOUND ABDOMEN LIMITED RIGHT UPPER QUADRANT

[Series 1: us abdomen limited ruq (liver/gb) · 14 of 37 slices shown]
[im 1/37]
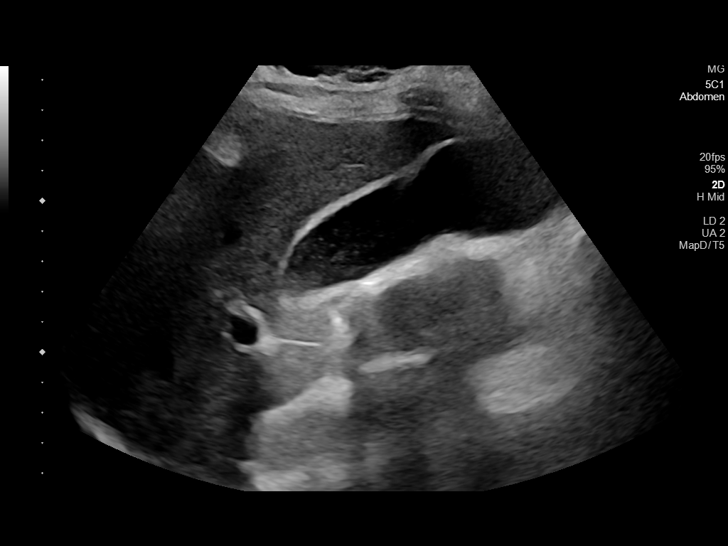
[im 4/37]
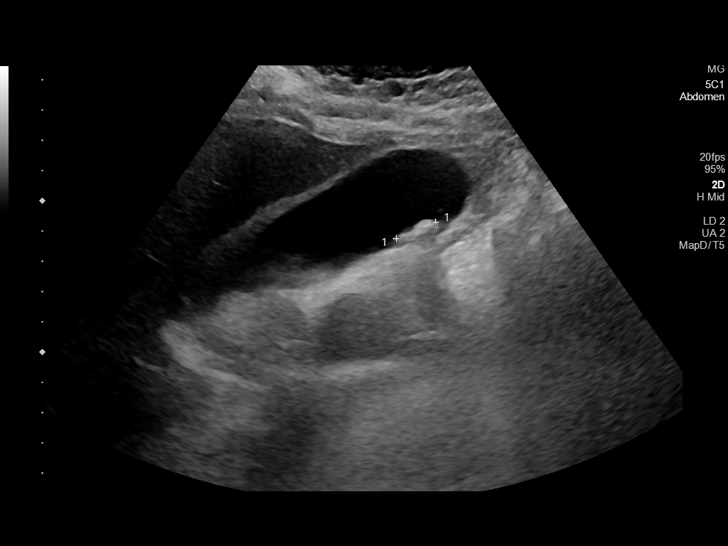
[im 7/37]
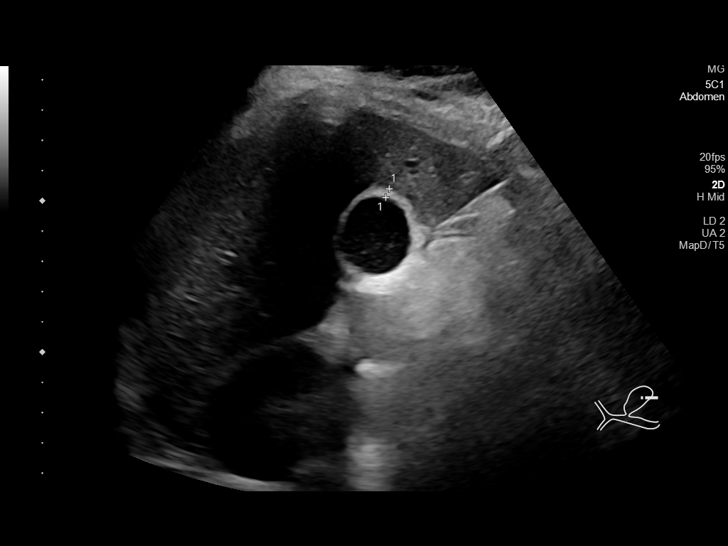
[im 10/37]
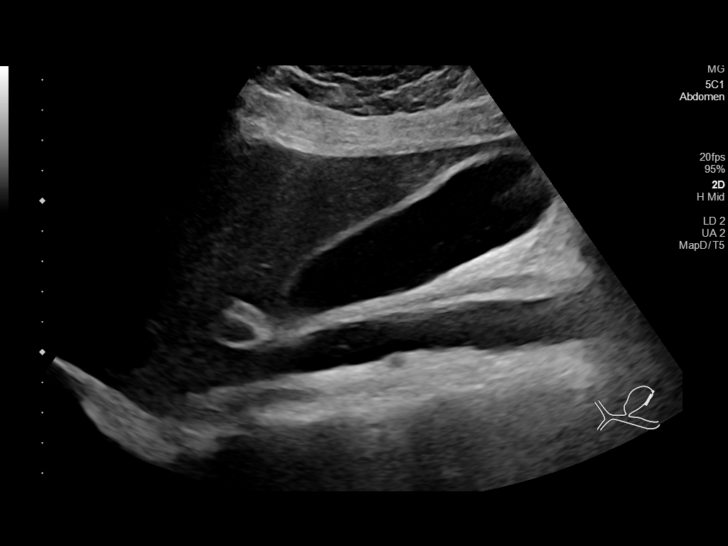
[im 13/37]
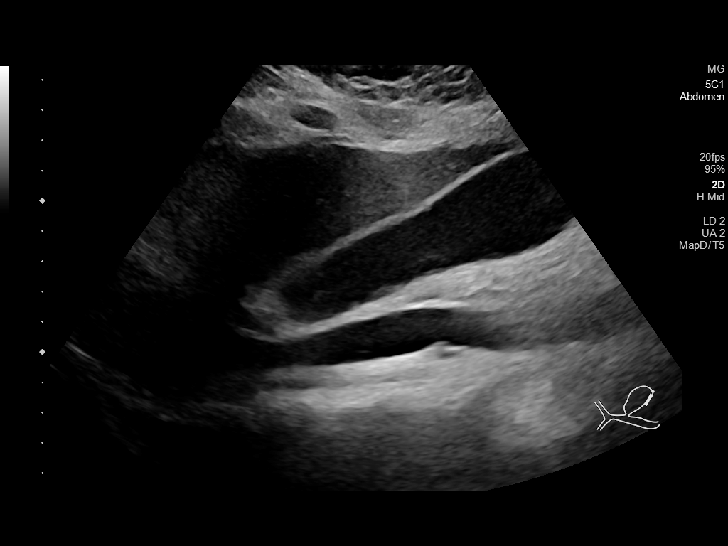
[im 14/37]
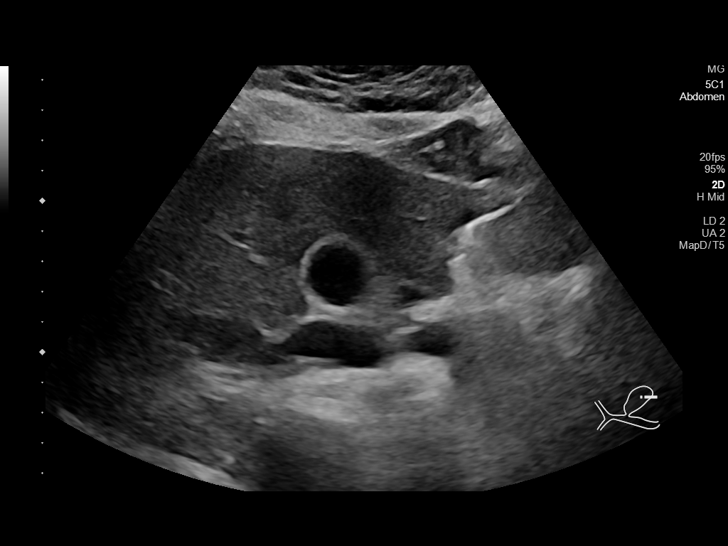
[im 17/37]
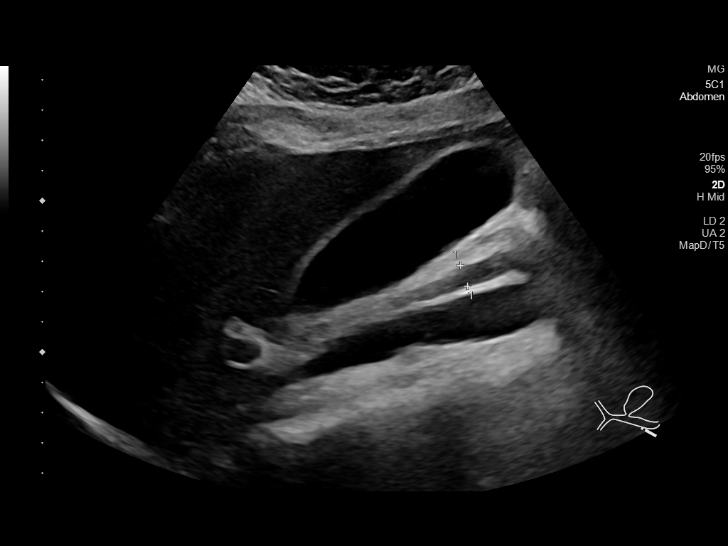
[im 20/37]
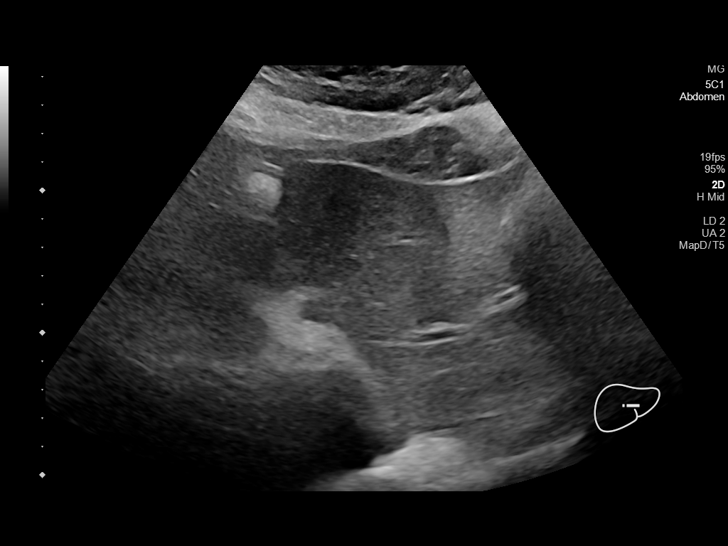
[im 23/37]
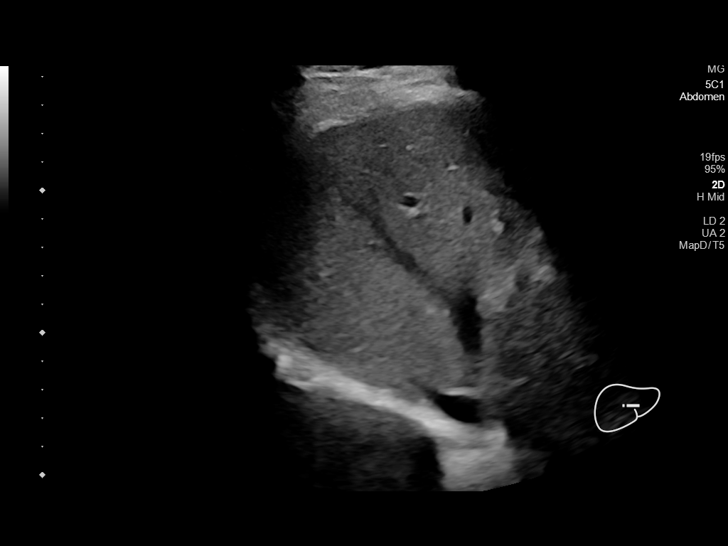
[im 25/37]
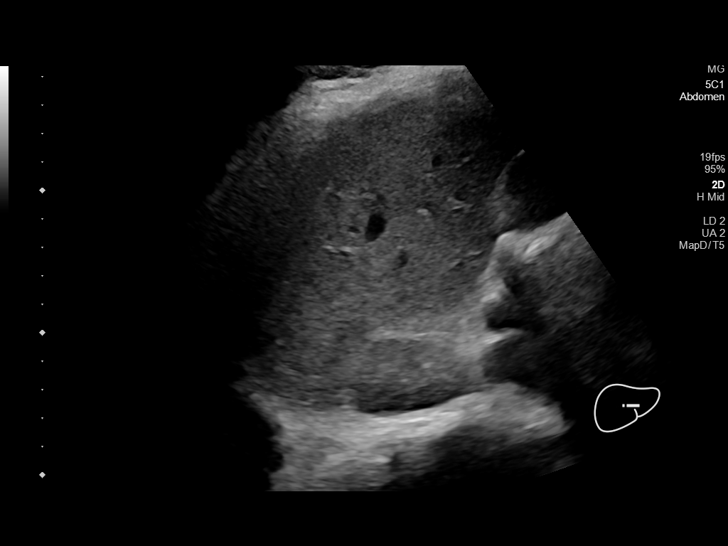
[im 28/37]
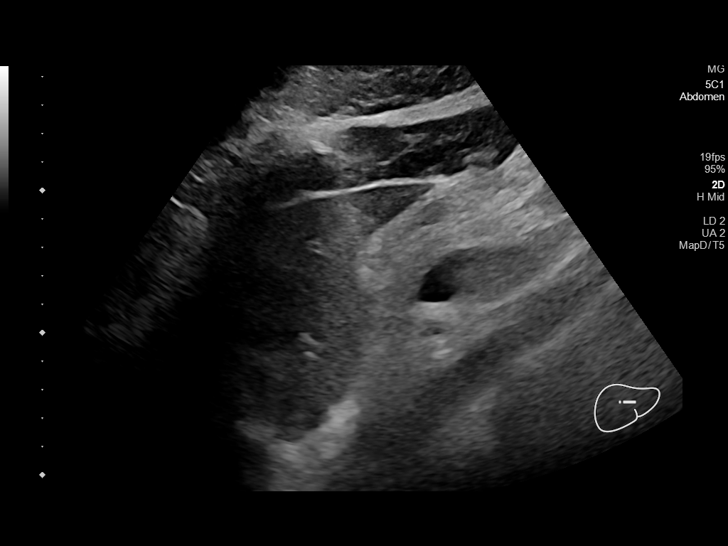
[im 31/37]
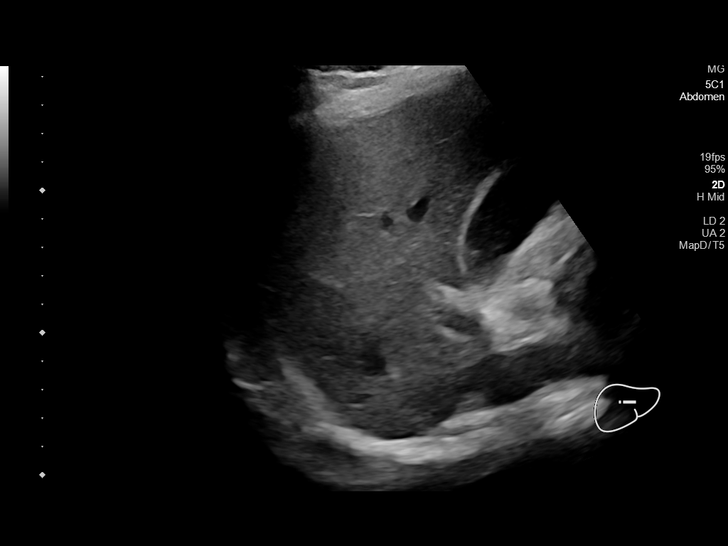
[im 34/37]
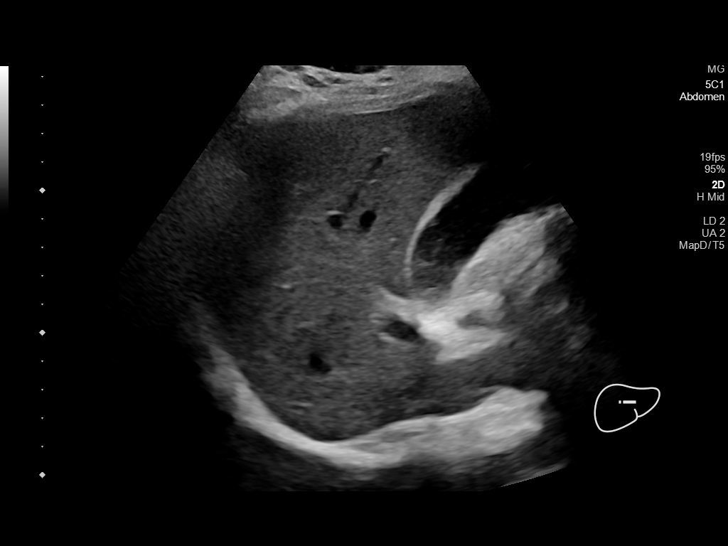
[im 37/37]
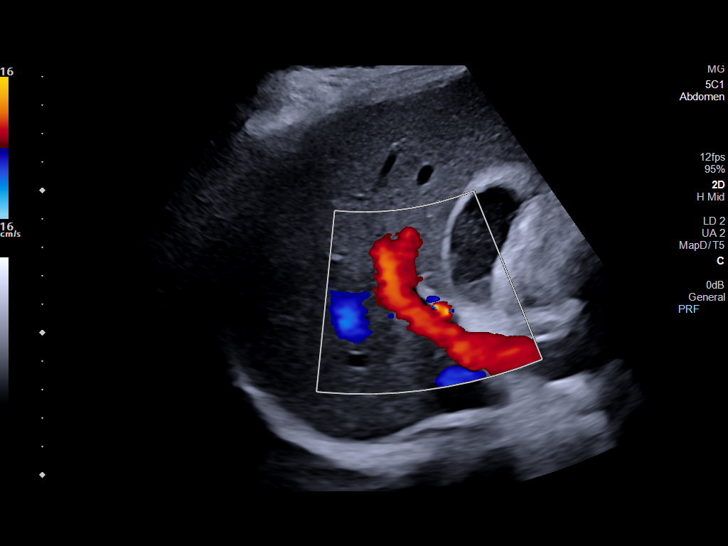

[14 of 25 positions shown; findings below may reference images not displayed]

FINDINGS: Gallbladder:

A 14 mm shadowing gallstone is again noted within the fundus the
gallbladder. Gallbladder wall is mildly thickened at 3.1 mm.
Increased sludge is present within the gallbladder. No sonographic
Murphy sign was elicited.

Common bile duct:

Diameter: 2.7 mm, within normal limits.

Liver:

No focal lesion identified. Within normal limits in parenchymal
echogenicity. Portal vein is patent on color Doppler imaging with
normal direction of blood flow towards the liver.

Other: None.
IMPRESSION: 1. Increased gallbladder sludge and gallbladder wall thickening in
the presence of gallstones concerning for developing cholecystitis.
2. Stable appearance of calcified gallstone.

## 2023-12-09 NOTE — Progress Notes (Signed)
 Telemedicine video encounter documentation  This video encounter was conducted with the patient's (or proxy's) verbal consent via secure, interactive audio and video telecommunications while in clinic/office/hospital.  The patient (or proxy) was instructed to have this encounter in a suitably private space and to only have persons present to whom they give permission to participate. In addition, patient identity was confirmed by use of name plus two identifiers.   Chief Complaint:   Chief Complaint  Patient presents with  . Cough    Ongoing cough after have flu, diagnosed with flu last week     Subjective  Deborah Weber is a 28 y.o. female who presents for Cough (Ongoing cough after have flu, diagnosed with flu last week ) HPI  History of Present Illness Deborah Weber is a 28 year old female with asthma who presents with persistent cough following a flu diagnosis.  She was diagnosed with the flu a week ago and has since experienced a persistent cough. The cough is triggered by talking or any activity, and over-the-counter medications like Delsym, Robitussin, and cough drops have not been effective. No headaches or nasal congestion are present.  She has a history of asthma, typically managed with albuterol, which has not been effective during this episode. She is currently using budesonide twice daily, which is her son's medication, as it is not listed in her records. She has used oral steroids in the past but prefers to avoid them due to side effects.  In the review of symptoms, she denies fever and shortness of breath but emphasizes the inability to stop coughing. She is not a smoker.   Patient Active Problem List  Diagnosis  . Palpitations  . Exertional shortness of breath  . Inappropriate sinus tachycardia (CMS/HHS-HCC)  . Other insomnia  . Irritable bowel syndrome with diarrhea  . Nausea  . Gastroesophageal reflux disease without esophagitis  . Herpes simplex vulvovaginitis   . Genital herpes simplex  . Migraine  . Iron deficiency anemia  . Mild intermittent asthma without complication (HHS-HCC)    Outpatient Medications Prior to Visit  Medication Sig Dispense Refill  . acetaminophen  (TYLENOL ) 500 MG tablet Take 1,000 mg by mouth every 6 (six) hours as needed    . acyclovir (ZOVIRAX) 400 MG tablet Take 1 tablet (400 mg total) by mouth 2 (two) times daily 60 tablet 9  . albuterol MDI, PROVENTIL, VENTOLIN, PROAIR, HFA 90 mcg/actuation inhaler Inhale 2 inhalations into the lungs every 4 (four) hours as needed (spasmodic cough)    . ferrous sulfate 325 (65 FE) MG tablet Take 325 mg by mouth daily with breakfast    . lifitegrast (XIIDRA OPHTH) Apply to eye    . melatonin 5 mg Tab Take 5 mg by mouth at bedtime    . pantoprazole  (PROTONIX ) 40 MG DR tablet Take 1 tablet (40 mg total) by mouth once daily 30 tablet 11  . tiZANidine (ZANAFLEX) 2 MG tablet Take 1 tablet (2 mg total) by mouth 3 (three) times daily 90 tablet 11  . XDEMVY 0.25 % Drop      No facility-administered medications prior to visit.     Review of Systems    Objective  Vitals as reported by patient: Vitals:   12/09/23 1020  Weight: 96.2 kg (212 lb)  PainSc: 0-No pain   Body mass index is 31.31 kg/m. Physical Exam   Constitutional: alert, interactive with provider, cooperative, in no distress Mental status: oriented x 3, good historian, appropriate mood and behavior, thought content  appears normal Respiratory: no respiratory distress, no audible wheezing intermittent cough  Results      Assessment/Plan:   Assessment & Plan Asthma exacerbation Asthma exacerbated by recent influenza infection. Persistent symptoms despite albuterol and budesonide. Discussed need for prednisone and addition of Symbicort for better control. Advised starting Symbicort at onset of future respiratory infections to prevent exacerbations. - Prescribe Symbicort (budesonide/formoterol) two puffs twice  daily. Instruct to rinse mouth after use to prevent oral thrush. - Prescribe prednisone 50 mg once daily for five days. - Continue albuterol every four hours as needed. - Discontinue nebulized steroid while using Symbicort. - Send prescriptions to pharmacy.  Influenza Diagnosed with influenza one week ago. Persistent cough but no fever or shortness of breath. - Continue supportive care.  General Health Maintenance Non-smoker, beneficial for respiratory health. - Encourage continued abstinence from smoking.  Follow-up - Follow up if symptoms persist or worsen. Diagnoses and all orders for this visit:  Mild intermittent asthma with acute exacerbation (HHS-HCC) -     predniSONE (DELTASONE) 50 MG tablet; Take 1 tablet (50 mg total) by mouth once daily -     budesonide-formoteroL (SYMBICORT) 160-4.5 mcg/actuation inhaler; Inhale 2 inhalations into the lungs 2 (two) times daily  History of influenza          This visit was coded based on medical decision making (MDM).           Future Appointments   This patient does not currently have any appointments scheduled.     There are no Patient Instructions on file for this visit.  An after visit summary was provided for the patient either in written format (printed) or through MyChart.  This note has been created using automated tools and reviewed for accuracy by KAREN JUTTA BEHLING.

## 2024-04-13 NOTE — Progress Notes (Signed)
 Telemedicine video encounter documentation  This video encounter was conducted with the patient's (or proxy's) verbal consent via secure, interactive audio and video telecommunications while in clinic/office/hospital.  The patient (or proxy) was instructed to have this encounter in a suitably private space and to only have persons present to whom they give permission to participate. In addition, patient identity was confirmed by use of name plus two identifiers.   Chief Complaint:   Chief Complaint  Patient presents with  . Sinusitis    Pt c/o sinus congestion headache sinus pressure started 2 days     Subjective:   Deborah Weber is a 28 y.o. female established patient. Video visit today for:  History of Present Illness  Patient reports 2-1/2-week history of URI symptoms with persistent congestion now having sinus pain and pressure behind her eyes.  No fever chills or bodyaches.  Patient continue to use Sudafed and Tylenol  for symptomatic relief.   Patient Active Problem List  Diagnosis  . Palpitations  . Exertional shortness of breath  . Inappropriate sinus tachycardia (CMS/HHS-HCC)  . Other insomnia  . Irritable bowel syndrome with diarrhea  . Nausea  . Gastroesophageal reflux disease without esophagitis  . Herpes simplex vulvovaginitis  . Genital herpes simplex  . Migraine  . Iron deficiency anemia  . Mild intermittent asthma without complication (HHS-HCC)    Outpatient Medications Prior to Visit  Medication Sig Dispense Refill  . acetaminophen  (TYLENOL ) 500 MG tablet Take 1,000 mg by mouth every 6 (six) hours as needed    . acyclovir (ZOVIRAX) 400 MG tablet Take 1 tablet (400 mg total) by mouth 2 (two) times daily 60 tablet 9  . albuterol MDI, PROVENTIL, VENTOLIN, PROAIR, HFA 90 mcg/actuation inhaler Inhale 2 inhalations into the lungs every 4 (four) hours as needed (spasmodic cough)    . budesonide-formoteroL (SYMBICORT) 160-4.5 mcg/actuation inhaler Inhale 2  inhalations into the lungs 2 (two) times daily 6 g 1  . ferrous sulfate 325 (65 FE) MG tablet Take 325 mg by mouth daily with breakfast    . lifitegrast (XIIDRA OPHTH) Apply to eye    . melatonin 5 mg Tab Take 5 mg by mouth at bedtime    . pantoprazole  (PROTONIX ) 40 MG DR tablet Take 1 tablet (40 mg total) by mouth once daily 30 tablet 11  . predniSONE (DELTASONE) 50 MG tablet Take 1 tablet (50 mg total) by mouth once daily 5 tablet 0  . tiZANidine (ZANAFLEX) 2 MG tablet Take 1 tablet (2 mg total) by mouth 3 (three) times daily 90 tablet 11  . XDEMVY 0.25 % Drop      No facility-administered medications prior to visit.     ROS   Objective:   Vitals as reported by patient: Vitals:   04/13/24 1509  Weight: 89.8 kg (198 lb)  PainSc:   6  PainLoc: Head - Entire   Body mass index is 29.24 kg/m. Physical Exam   Constitutional: alert, interactive with provider, cooperative, in no distress Mental status: oriented x 3, good historian, appropriate mood and behavior, thought content appears normal Respiratory: no respiratory distress, no audible wheezing, speaking in full sentences Noncontributory  Assessment/Plan:   Assessment & Plan  Diagnoses and all orders for this visit:  Acute sinusitis, recurrence not specified, unspecified location  Other orders -     doxycycline (VIBRA-TABS) 100 MG tablet; Take 1 tablet (100 mg total) by mouth 2 (two) times daily for 10 days      This visit was  coded based on medical decision making (MDM).            Future Appointments   This patient does not currently have any appointments scheduled.     Patient Instructions  No results found for this visit on 04/13/24.  -Increase fluids -Fever and body aches - acetaminophen  (Tylenol ) or ibuprofen  (Motrin , Advil ) as directed, wear lightweight clothing -Sore throat - salt water gargles or throat lozenges -Stuffy nose - saline (salt water) nasal sprays, salt-water drops, warm showers  will help drain sinuses and can be used long-term.  -Cough - dextromethorphan found in most cough preparations will help calm a cough, a teaspoon of honey can also be effective. Cough drops are comforting and may moisten the throat; vaporizers and humidifiers help with irritation. -Congestion-guaifenisen can be used to help loosen sinus congestion. -Mucinex D twice daily as needed for congestion and to promote drainage  -Delsym as directed for cough during the day    An after visit summary was provided for the patient either in written format (printed) or through MyChart.  This note has been created using automated tools and reviewed for accuracy by GERARD P SHEPHERD.

## 2024-07-18 NOTE — Progress Notes (Signed)
 Chief Complaint  Patient presents with  . Menstrual Problem    Plan B taken on 8/23. Period due 9/9. Positive test on 9/13 (digital)  All tests following 9/13 negative.  Bloodwork drawn on 9/13 came back negative. Experiencing cramping, but no cycle for now 7 days. Periods have been regular for last 10 years.      Subjective  Sophiarose is a 28 y.o. female who presents for Menstrual Problem (Plan B taken on 8/23./Period due 9/9./Positive test on 9/13 (digital) /All tests following 9/13 negative. Idelle drawn on 9/13 came back negative./Experiencing cramping, but no cycle for now 7 days. Periods have been regular for last 10 years. /) HPI History of Present Illness Mackayla Earnie Gains is a 28 year old female with a history of ectopic pregnancy who presents with a missed period and left-sided abdominal cramping.  She experienced a missed period, which is currently seven days late, and left-sided abdominal cramping. The cramping is described as dull and constant. She has not experienced any bleeding or spotting and lacks her typical premenstrual symptoms.  She received a positive digital pregnancy test on Saturday, followed by a negative result on a subsequent test. She then sought care at urgent care where blood work was performed, which also returned negative for pregnancy.  She has a history of an ectopic pregnancy, which is a concern for her given her current symptoms. She also mentions having taken Plan B and notes a previous experience where she became pregnant despite taking Plan B.  Review of Systems See hpi Patient Active Problem List  Diagnosis  . Palpitations  . Exertional shortness of breath  . Inappropriate sinus tachycardia (CMS/HHS-HCC)  . Other insomnia  . Irritable bowel syndrome with diarrhea  . Nausea  . Gastroesophageal reflux disease without esophagitis  . Herpes simplex vulvovaginitis  . Genital herpes simplex  . Migraine  . Iron deficiency anemia  . Mild  intermittent asthma without complication (HHS-HCC)    Outpatient Medications Prior to Visit  Medication Sig Dispense Refill  . acetaminophen  (TYLENOL ) 500 MG tablet Take 1,000 mg by mouth every 6 (six) hours as needed    . acyclovir (ZOVIRAX) 400 MG tablet Take 1 tablet (400 mg total) by mouth 2 (two) times daily 60 tablet 9  . albuterol MDI, PROVENTIL, VENTOLIN, PROAIR, HFA 90 mcg/actuation inhaler Inhale 2 inhalations into the lungs every 4 (four) hours as needed (spasmodic cough)    . budesonide-formoteroL (SYMBICORT) 160-4.5 mcg/actuation inhaler Inhale 2 inhalations into the lungs 2 (two) times daily 6 g 1  . tiZANidine (ZANAFLEX) 2 MG tablet Take 1 tablet (2 mg total) by mouth 3 (three) times daily 90 tablet 11  . ferrous sulfate 325 (65 FE) MG tablet Take 325 mg by mouth daily with breakfast (Patient not taking: Reported on 07/18/2024)    . lifitegrast (XIIDRA OPHTH) Apply to eye (Patient not taking: Reported on 07/18/2024)    . melatonin 5 mg Tab Take 5 mg by mouth at bedtime (Patient not taking: Reported on 07/18/2024)    . pantoprazole  (PROTONIX ) 40 MG DR tablet Take 1 tablet (40 mg total) by mouth once daily (Patient not taking: Reported on 07/18/2024) 30 tablet 11  . predniSONE (DELTASONE) 50 MG tablet Take 1 tablet (50 mg total) by mouth once daily (Patient not taking: Reported on 07/18/2024) 5 tablet 0  . XDEMVY 0.25 % Drop  (Patient not taking: Reported on 07/18/2024)     No facility-administered medications prior to visit.  Objective  Vitals:   07/18/24 1530 07/18/24 1533  BP:  112/68  Pulse:  73  Weight: 98.5 kg (217 lb 3.2 oz)   PainSc:    4  PainLoc:  Other (Comment)   Body mass index is 32.07 kg/m.  Home Vitals:     Physical Exam Physical Exam    Constitutional: alert, in NAD, and communicates well Respiratory: clear to auscultation, without rales or wheezes  Cardiovascular: regular rate and rhythm and without murmurs, rubs or gallops +LLQ tenderness.   Soft, no rebound Results      Assessment/Plan:   Assessment & Plan Amenorrhea with left lower quadrant pain, rule out ectopic pregnancy Amenorrhea for 7 days with left lower quadrant pain. Negative pregnancy tests. Concern for ectopic pregnancy due to history, despite negative tests. Differential includes ectopic pregnancy versus delayed menstruation. - Repeat blood pregnancy test. - Order pelvic ultrasound to evaluate for ectopic pregnancy. Diagnoses and all orders for this visit:  Missed menses, unspecified -     US  pelvic protocol transabdominal and transvaginal complete; Future -     HCG Blood Quantitative, Pregnancy Test; Future    This visit was coded based on medical decision making (MDM).           Future Appointments   This patient does not currently have any appointments scheduled.     There are no Patient Instructions on file for this visit.  An after visit summary was provided for the patient either in written format (printed) or through My [facility].  This note has been created using automated tools and reviewed for accuracy by JONATHON COOPER HODGES.
